# Patient Record
Sex: Female | Born: 1975
Health system: Southern US, Community
[De-identification: ages and names within clinical notes are randomized; demographics above are authoritative.]

## PROBLEM LIST (undated history)

## (undated) ENCOUNTER — Inpatient Hospital Stay (HOSPITAL_COMMUNITY): Payer: Self-pay

## (undated) DIAGNOSIS — D649 Anemia, unspecified: Secondary | ICD-10-CM

## (undated) DIAGNOSIS — O10919 Unspecified pre-existing hypertension complicating pregnancy, unspecified trimester: Secondary | ICD-10-CM

## (undated) DIAGNOSIS — Z332 Encounter for elective termination of pregnancy: Secondary | ICD-10-CM

## (undated) DIAGNOSIS — Z9889 Other specified postprocedural states: Secondary | ICD-10-CM

## (undated) DIAGNOSIS — R112 Nausea with vomiting, unspecified: Secondary | ICD-10-CM

## (undated) DIAGNOSIS — I10 Essential (primary) hypertension: Secondary | ICD-10-CM

## (undated) HISTORY — DX: Other specified postprocedural states: Z98.890

## (undated) HISTORY — PX: ABLATION: SHX5711

## (undated) HISTORY — PX: WISDOM TOOTH EXTRACTION: SHX21

## (undated) HISTORY — PX: BUNIONECTOMY: SHX129

## (undated) HISTORY — PX: DILATION AND CURETTAGE OF UTERUS: SHX78

## (undated) HISTORY — DX: Essential (primary) hypertension: I10

## (undated) HISTORY — DX: Anemia, unspecified: D64.9

## (undated) HISTORY — DX: Other specified postprocedural states: R11.2

---

## 2002-11-26 ENCOUNTER — Emergency Department (HOSPITAL_COMMUNITY): Admission: EM | Admit: 2002-11-26 | Discharge: 2002-11-27 | Payer: Self-pay | Admitting: Emergency Medicine

## 2002-11-26 ENCOUNTER — Encounter: Payer: Self-pay | Admitting: Emergency Medicine

## 2002-11-26 ENCOUNTER — Emergency Department (HOSPITAL_COMMUNITY): Admission: EM | Admit: 2002-11-26 | Discharge: 2002-11-26 | Payer: Self-pay | Admitting: *Deleted

## 2002-11-26 ENCOUNTER — Encounter: Payer: Self-pay | Admitting: *Deleted

## 2003-01-11 ENCOUNTER — Ambulatory Visit (HOSPITAL_COMMUNITY): Admission: RE | Admit: 2003-01-11 | Discharge: 2003-01-11 | Payer: Self-pay | Admitting: Internal Medicine

## 2003-01-11 ENCOUNTER — Encounter (INDEPENDENT_AMBULATORY_CARE_PROVIDER_SITE_OTHER): Payer: Self-pay

## 2003-07-21 ENCOUNTER — Emergency Department (HOSPITAL_COMMUNITY): Admission: EM | Admit: 2003-07-21 | Discharge: 2003-07-22 | Payer: Self-pay | Admitting: Emergency Medicine

## 2007-08-09 ENCOUNTER — Emergency Department (HOSPITAL_COMMUNITY): Admission: EM | Admit: 2007-08-09 | Discharge: 2007-08-09 | Payer: Self-pay | Admitting: Emergency Medicine

## 2007-10-30 ENCOUNTER — Other Ambulatory Visit: Admission: RE | Admit: 2007-10-30 | Discharge: 2007-10-30 | Payer: Self-pay | Admitting: Gynecology

## 2009-10-28 ENCOUNTER — Other Ambulatory Visit: Admission: RE | Admit: 2009-10-28 | Discharge: 2009-10-28 | Payer: Self-pay | Admitting: Family Medicine

## 2011-01-12 NOTE — Op Note (Signed)
   NAME:  SHANOAH, ASBILL                ACCOUNT NO.:  [id number]   MEDICAL RECORD NO.:  [id number]                   PATIENT TYPE:  AMB   LOCATION:  ENDO                                 FACILITY:  [facility]   PHYSICIAN:  Lina Sar, M.D. [facility]               DATE OF BIRTH:  07-18-1976   DATE OF PROCEDURE:  01/11/2003  DATE OF DISCHARGE:                                 OPERATIVE REPORT   PROCEDURE:  Colonoscopy.   INDICATIONS FOR PROCEDURE:  This 35 year old African-American female  presented to the emergency room on November 26, 2002 with severe right lower  quadrant abdominal pain. CT scan of the abdomen showed terminal ileitis and  changes around the cecum with enlargement in lymph nodes worrisome for colon  disease. She has had complete resolution of her abdominal pain and has done  well on Pentasa 250 mg, 9 tablets a day. She is now undergoing colonoscopy  to further evaluate her for possible Crohn disease.   ENDOSCOPE:  Olympus single channel video endoscope.   SEDATION:  Versed 8 mg IV, Demerol 80 mg IV.   FINDINGS:  The Olympus single channel video endoscope passed under direct  vision through the rectum to the sigmoid colon. The patient was monitored by  pulse oximeter, her oxygen saturations were normal. Her prep was excellent.  The anal canal and the rectal ampulla was unremarkable. There were no  fistulas or fissures. The colonoscope passed easily through the normal  appearing descending colon, splenic flexure, transverse colon, hepatic  flexure to the cecum. The cecal pouch was completely unremarkable with  normal appearing ileocecal valve. We were able to intubate the terminal  ileum over the length of at least 15 cm and it appeared normal. There was no  evidence of Crohn disease, there were no inflammatory changes, no change in  the caliber of the terminal ileum. Video photographs as well as biopsies of  the terminal ileum were obtained. The ileocecal valve again  appeared normal.  The colonoscope was then retracted, colon decompressed. The patient  tolerated the procedure well.   IMPRESSION:  Normal colonoscopy to the terminal ileum status post biopsy, no  evidence of Crohn disease.   PLAN:  The patient's right lower quadrant abdominal pain and ileitis were  most likely infectious. There was no evidence of any chronic changes at this  time. She will discontinue her Pentasa and will check with [place] in six months  to make sure she remains asymptomatic.                                               Lina Sar, M.D. [facility]    DB/MEDQ  D:  01/11/2003  T:  01/11/2003  Job:  [id number]

## 2011-03-28 ENCOUNTER — Encounter: Payer: Self-pay | Admitting: Podiatry

## 2011-03-28 DIAGNOSIS — M84376A Stress fracture, unspecified foot, initial encounter for fracture: Secondary | ICD-10-CM

## 2011-12-25 ENCOUNTER — Other Ambulatory Visit: Payer: Self-pay | Admitting: Family Medicine

## 2011-12-25 DIAGNOSIS — Z113 Encounter for screening for infections with a predominantly sexual mode of transmission: Secondary | ICD-10-CM | POA: Insufficient documentation

## 2011-12-25 DIAGNOSIS — Z0000 Encounter for general adult medical examination without abnormal findings: Secondary | ICD-10-CM | POA: Insufficient documentation

## 2011-12-25 DIAGNOSIS — Z202 Contact with and (suspected) exposure to infections with a predominantly sexual mode of transmission: Secondary | ICD-10-CM | POA: Insufficient documentation

## 2012-01-01 ENCOUNTER — Other Ambulatory Visit (HOSPITAL_COMMUNITY)
Admission: RE | Admit: 2012-01-01 | Discharge: 2012-01-01 | Disposition: A | Discharge status: Home / Self Care | Payer: PRIVATE HEALTH INSURANCE | Source: Ambulatory Visit | Attending: Family Medicine | Admitting: Family Medicine

## 2012-07-07 ENCOUNTER — Ambulatory Visit (INDEPENDENT_AMBULATORY_CARE_PROVIDER_SITE_OTHER): Payer: PRIVATE HEALTH INSURANCE | Admitting: Obstetrics and Gynecology

## 2012-07-07 VITALS — Ht 66.0 in

## 2012-07-07 DIAGNOSIS — Z331 Pregnant state, incidental: Secondary | ICD-10-CM

## 2012-07-07 NOTE — Progress Notes (Signed)
NOB interview today. Pt c/o some nausea that is relieved with small meals.  Chem 10   SG 1.025  PH 5  Protein trace Urine sent for culture and labs drawn. Pt to be called for NOB w/u appt.   Flu vaccine offered.  Pt to think about it until appt.  ld

## 2012-07-08 LAB — PRENATAL PANEL VII
Antibody Screen: NEGATIVE
Basophils Relative: 0 % (ref 0–1)
Eosinophils Absolute: 0.1 10*3/uL (ref 0.0–0.7)
Eosinophils Relative: 1 % (ref 0–5)
HCT: 33.1 % — ABNORMAL LOW (ref 36.0–46.0)
HIV: NONREACTIVE
Hemoglobin: 11.3 g/dL — ABNORMAL LOW (ref 12.0–15.0)
MCH: 30.1 pg (ref 26.0–34.0)
MCHC: 34.1 g/dL (ref 30.0–36.0)
MCV: 88 fL (ref 78.0–100.0)
Monocytes Absolute: 0.5 10*3/uL (ref 0.1–1.0)
Monocytes Relative: 6 % (ref 3–12)
Rh Type: POSITIVE

## 2012-07-09 LAB — HEMOGLOBINOPATHY EVALUATION
Hemoglobin Other: 0 %
Hgb A2 Quant: 2.8 % (ref 2.2–3.2)

## 2012-07-09 LAB — CULTURE, OB URINE

## 2012-07-16 ENCOUNTER — Telehealth: Payer: Self-pay | Admitting: Obstetrics and Gynecology

## 2012-08-07 ENCOUNTER — Telehealth: Payer: Self-pay | Admitting: Obstetrics and Gynecology

## 2012-08-07 NOTE — Telephone Encounter (Signed)
Pt returned call, informed pt may try Delsym or Robitussin for the cough, may try Plain Sudafed for any nasal issues, may take Tylenol (reg/ES) as directed, if no relief to call office, pt voices agreement.

## 2012-08-07 NOTE — Telephone Encounter (Signed)
Tc to pt regarding msg.  Lm on vm to call back. 

## 2012-08-11 ENCOUNTER — Encounter: Payer: Self-pay | Admitting: Obstetrics and Gynecology

## 2012-08-11 ENCOUNTER — Ambulatory Visit (INDEPENDENT_AMBULATORY_CARE_PROVIDER_SITE_OTHER): Payer: PRIVATE HEALTH INSURANCE | Admitting: Obstetrics and Gynecology

## 2012-08-11 VITALS — BP 102/72 | Wt 177.0 lb

## 2012-08-11 DIAGNOSIS — Z331 Pregnant state, incidental: Secondary | ICD-10-CM

## 2012-08-11 DIAGNOSIS — Z1389 Encounter for screening for other disorder: Secondary | ICD-10-CM

## 2012-08-11 DIAGNOSIS — Z3687 Encounter for antenatal screening for uncertain dates: Secondary | ICD-10-CM

## 2012-08-11 LAB — POCT WET PREP (WET MOUNT)
KOH Wet Prep POC: NEGATIVE
Trichomonas Wet Prep HPF POC: NEGATIVE

## 2012-08-11 LAB — OB RESULTS CONSOLE GC/CHLAMYDIA
Chlamydia: NEGATIVE
Gonorrhea: NEGATIVE

## 2012-08-11 NOTE — Patient Instructions (Signed)
ABCs of Pregnancy  A  Antepartum care is very important. Be sure you see your doctor and get prenatal care as soon as you think you are pregnant. At this time, you will be tested for infection, genetic abnormalities and potential problems with you and the pregnancy. This is the time to discuss diet, exercise, work, medications, labor, pain medication during labor and the possibility of a cesarean delivery. Ask any questions that may concern you. It is important to see your doctor regularly throughout your pregnancy. Avoid exposure to toxic substances and chemicals - such as cleaning solvents, lead and mercury, some insecticides, and paint. Pregnant women should avoid exposure to paint fumes, and fumes that cause you to feel ill, dizzy or faint. When possible, it is a good idea to have a pre-pregnancy consultation with your caregiver to begin some important recommendations your caregiver suggests such as, taking folic acid, exercising, quitting smoking, avoiding alcoholic beverages, etc.  B  Breastfeeding is the healthiest choice for both you and your baby. It has many nutritional benefits for the baby and health benefits for the mother. It also creates a very tight and loving bond between the baby and mother. Talk to your doctor, your family and friends, and your employer about how you choose to feed your baby and how they can support you in your decision. Not all birth defects can be prevented, but a woman can take actions that may increase her chance of having a healthy baby. Many birth defects happen very early in pregnancy, sometimes before a woman even knows she is pregnant. Birth defects or abnormalities of any child in your or the father's family should be discussed with your caregiver. Get a good support bra as your breast size changes. Wear it especially when you exercise and when nursing.   C  Celebrate the news of your pregnancy with the your spouse/father and family. Childbirth classes are helpful to  take for you and the spouse/father because it helps to understand what happens during the pregnancy, labor and delivery. Cesarean delivery should be discussed with your doctor so you are prepared for that possibility. The pros and cons of circumcision if it is a boy, should be discussed with your pediatrician. Cigarette smoking during pregnancy can result in low birth weight babies. It has been associated with infertility, miscarriages, tubal pregnancies, infant death (mortality) and poor health (morbidity) in childhood. Additionally, cigarette smoking may cause long-term learning disabilities. If you smoke, you should try to quit before getting pregnant and not smoke during the pregnancy. Secondary smoke may also harm a mother and her developing baby. It is a good idea to ask people to stop smoking around you during your pregnancy and after the baby is born. Extra calcium is necessary when you are pregnant and is found in your prenatal vitamin, in dairy products, green leafy vegetables and in calcium supplements.  D  A healthy diet according to your current weight and height, along with vitamins and mineral supplements should be discussed with your caregiver. Domestic abuse or violence should be made known to your doctor right away to get the situation corrected. Drink more water when you exercise to keep hydrated. Discomfort of your back and legs usually develops and progresses from the middle of the second trimester through to delivery of the baby. This is because of the enlarging baby and uterus, which may also affect your balance. Do not take illegal drugs. Illegal drugs can seriously harm the baby and you. Drink extra   fluids (water is best) throughout pregnancy to help your body keep up with the increases in your blood volume. Drink at least 6 to 8 glasses of water, fruit juice, or milk each day. A good way to know you are drinking enough fluid is when your urine looks almost like clear water or is very light  yellow.   E  Eat healthy to get the nutrients you and your unborn baby need. Your meals should include the five basic food groups. Exercise (30 minutes of light to moderate exercise a day) is important and encouraged during pregnancy, if there are no medical problems or problems with the pregnancy. Exercise that causes discomfort or dizziness should be stopped and reported to your caregiver. Emotions during pregnancy can change from being ecstatic to depression and should be understood by you, your partner and your family.  F  Fetal screening with ultrasound, amniocentesis and monitoring during pregnancy and labor is common and sometimes necessary. Take 400 micrograms of folic acid daily both before, when possible, and during the first few months of pregnancy to reduce the risk of birth defects of the brain and spine. All women who could possibly become pregnant should take a vitamin with folic acid, every day. It is also important to eat a healthy diet with fortified foods (enriched grain products, including cereals, rice, breads, and pastas) and foods with natural sources of folate (orange juice, green leafy vegetables, beans, peanuts, broccoli, asparagus, peas, and lentils). The father should be involved with all aspects of the pregnancy including, the prenatal care, childbirth classes, labor, delivery, and postpartum time. Fathers may also have emotional concerns about being a father, financial needs, and raising a family.  G  Genetic testing should be done appropriately. It is important to know your family and the father's history. If there have been problems with pregnancies or birth defects in your family, report these to your doctor. Also, genetic counselors can talk with you about the information you might need in making decisions about having a family. You can call a major medical center in your area for help in finding a board-certified genetic counselor. Genetic testing and counseling should be done  before pregnancy when possible, especially if there is a history of problems in the mother's or father's family. Certain ethnic backgrounds are more at risk for genetic defects.  H  Get familiar with the hospital where you will be having your baby. Get to know how long it takes to get there, the labor and delivery area, and the hospital procedures. Be sure your medical insurance is accepted there. Get your home ready for the baby including, clothes, the baby's room (when possible), furniture and car seat. Hand washing is important throughout the day, especially after handling raw meat and poultry, changing the baby's diaper or using the bathroom. This can help prevent the spread of many bacteria and viruses that cause infection. Your hair may become dry and thinner, but will return to normal a few weeks after the baby is born. Heartburn is a common problem that can be treated by taking antacids recommended by your caregiver, eating smaller meals 5 or 6 times a day, not drinking liquids when eating, drinking between meals and raising the head of your bed 2 to 3 inches.  I  Insurance to cover you, the baby, doctor and hospital should be reviewed so that you will be prepared to pay any costs not covered by your insurance plan. If you do not have medical insurance,   there are usually clinics and services available for you in your community. Take 30 milligrams of iron during your pregnancy as prescribed by your doctor to reduce the risk of low red blood cells (anemia) later in pregnancy. All women of childbearing age should eat a diet rich in iron.  J  There should be a joint effort for the mother, father and any other children to adapt to the pregnancy financially, emotionally, and psychologically during the pregnancy. Join a support group for moms-to-be. Or, join a class on parenting or childbirth. Have the family participate when possible.  K  Know your limits. Let your caregiver know if you experience any of the  following:   · Pain of any kind.  · Strong cramps.  · You develop a lot of weight in a short period of time (5 pounds in 3 to 5 days).  · Vaginal bleeding, leaking of amniotic fluid.  · Headache, vision problems.  · Dizziness, fainting, shortness of breath.  · Chest pain.  · Fever of 102° F (38.9° C) or higher.  · Gush of clear fluid from your vagina.  · Painful urination.  · Domestic violence.  · Irregular heartbeat (palpitations).  · Rapid beating of the heart (tachycardia).  · Constant feeling sick to your stomach (nauseous) and vomiting.  · Trouble walking, fluid retention (edema).  · Muscle weakness.  · If your baby has decreased activity.  · Persistent diarrhea.  · Abnormal vaginal discharge.  · Uterine contractions at 20-minute intervals.  · Back pain that travels down your leg.  L  Learn and practice that what you eat and drink should be in moderation and healthy for you and your baby. Legal drugs such as alcohol and caffeine are important issues for pregnant women. There is no safe amount of alcohol a woman can drink while pregnant. Fetal alcohol syndrome, a disorder characterized by growth retardation, facial abnormalities, and central nervous system dysfunction, is caused by a woman's use of alcohol during pregnancy. Caffeine, found in tea, coffee, soft drinks and chocolate, should also be limited. Be sure to read labels when trying to cut down on caffeine during pregnancy. More than 200 foods, beverages, and over-the-counter medications contain caffeine and have a high salt content! There are coffees and teas that do not contain caffeine.  M  Medical conditions such as diabetes, epilepsy, and high blood pressure should be treated and kept under control before pregnancy when possible, but especially during pregnancy. Ask your caregiver about any medications that may need to be changed or adjusted during pregnancy. If you are currently taking any medications, ask your caregiver if it is safe to take them  while you are pregnant or before getting pregnant when possible. Also, be sure to discuss any herbs or vitamins you are taking. They are medicines, too! Discuss with your doctor all medications, prescribed and over-the-counter, that you are taking. During your prenatal visit, discuss the medications your doctor may give you during labor and delivery.  N  Never be afraid to ask your doctor or caregiver questions about your health, the progress of the pregnancy, family problems, stressful situations, and recommendation for a pediatrician, if you do not have one. It is better to take all precautions and discuss any questions or concerns you may have during your office visits. It is a good idea to write down your questions before you visit the doctor.  O  Over-the-counter cough and cold remedies may contain alcohol or other ingredients that should   be avoided during pregnancy. Ask your caregiver about prescription, herbs or over-the-counter medications that you are taking or may consider taking while pregnant.   P  Physical activity during pregnancy can benefit both you and your baby by lessening discomfort and fatigue, providing a sense of well-being, and increasing the likelihood of early recovery after delivery. Light to moderate exercise during pregnancy strengthens the belly (abdominal) and back muscles. This helps improve posture. Practicing yoga, walking, swimming, and cycling on a stationary bicycle are usually safe exercises for pregnant women. Avoid scuba diving, exercise at high altitudes (over 3000 feet), skiing, horseback riding, contact sports, etc. Always check with your doctor before beginning any kind of exercise, especially during pregnancy and especially if you did not exercise before getting pregnant.  Q  Queasiness, stomach upset and morning sickness are common during pregnancy. Eating a couple of crackers or dry toast before getting out of bed. Foods that you normally love may make you feel sick to  your stomach. You may need to substitute other nutritious foods. Eating 5 or 6 small meals a day instead of 3 large ones may make you feel better. Do not drink with your meals, drink between meals. Questions that you have should be written down and asked during your prenatal visits.  R  Read about and make plans to baby-proof your home. There are important tips for making your home a safer environment for your baby. Review the tips and make your home safer for you and your baby. Read food labels regarding calories, salt and fat content in the food.  S  Saunas, hot tubs, and steam rooms should be avoided while you are pregnant. Excessive high heat may be harmful during your pregnancy. Your caregiver will screen and examine you for sexually transmitted diseases and genetic disorders during your prenatal visits. Learn the signs of labor. Sexual relations while pregnant is safe unless there is a medical or pregnancy problem and your caregiver advises against it.  T  Traveling long distances should be avoided especially in the third trimester of your pregnancy. If you do have to travel out of state, be sure to take a copy of your medical records and medical insurance plan with you. You should not travel long distances without seeing your doctor first. Most airlines will not allow you to travel after 36 weeks of pregnancy. Toxoplasmosis is an infection caused by a parasite that can seriously harm an unborn baby. Avoid eating undercooked meat and handling cat litter. Be sure to wear gloves when gardening. Tingling of the hands and fingers is not unusual and is due to fluid retention. This will go away after the baby is born.  U  Womb (uterus) size increases during the first trimester. Your kidneys will begin to function more efficiently. This may cause you to feel the need to urinate more often. You may also leak urine when sneezing, coughing or laughing. This is due to the growing uterus pressing against your bladder,  which lies directly in front of and slightly under the uterus during the first few months of pregnancy. If you experience burning along with frequency of urination or bloody urine, be sure to tell your doctor. The size of your uterus in the third trimester may cause a problem with your balance. It is advisable to maintain good posture and avoid wearing high heels during this time. An ultrasound of your baby may be necessary during your pregnancy and is safe for you and your baby.  V    Vaccinations are an important concern for pregnant women. Get needed vaccines before pregnancy. Center for Disease Control (www.cdc.gov) has clear guidelines for the use of vaccines during pregnancy. Review the list, be sure to discuss it with your doctor. Prenatal vitamins are helpful and healthy for you and the baby. Do not take extra vitamins except what is recommended. Taking too much of certain vitamins can cause overdose problems. Continuous vomiting should be reported to your caregiver. Varicose veins may appear especially if there is a family history of varicose veins. They should subside after the delivery of the baby. Support hose helps if there is leg discomfort.  W  Being overweight or underweight during pregnancy may cause problems. Try to get within 15 pounds of your ideal weight before pregnancy. Remember, pregnancy is not a time to be dieting! Do not stop eating or start skipping meals as your weight increases. Both you and your baby need the calories and nutrition you receive from a healthy diet. Be sure to consult with your doctor about your diet. There is a formula and diet plan available depending on whether you are overweight or underweight. Your caregiver or nutritionist can help and advise you if necessary.  X  Avoid X-rays. If you must have dental work or diagnostic tests, tell your dentist or physician that you are pregnant so that extra care can be taken. X-rays should only be taken when the risks of not taking  them outweigh the risk of taking them. If needed, only the minimum amount of radiation should be used. When X-rays are necessary, protective lead shields should be used to cover areas of the body that are not being X-rayed.  Y  Your baby loves you. Breastfeeding your baby creates a loving and very close bond between the two of you. Give your baby a healthy environment to live in while you are pregnant. Infants and children require constant care and guidance. Their health and safety should be carefully watched at all times. After the baby is born, rest or take a nap when the baby is sleeping.  Z  Get your ZZZs. Be sure to get plenty of rest. Resting on your side as often as possible, especially on your left side is advised. It provides the best circulation to your baby and helps reduce swelling. Try taking a nap for 30 to 45 minutes in the afternoon when possible. After the baby is born rest or take a nap when the baby is sleeping. Try elevating your feet for that amount of time when possible. It helps the circulation in your legs and helps reduce swelling.   Most information courtesy of the CDC.  Document Released: 08/13/2005 Document Revised: 11/05/2011 Document Reviewed: 04/27/2009  [employer]® Patient Information ©2013 [employer].

## 2012-08-11 NOTE — Progress Notes (Signed)
[redacted]w[redacted]d NOB workup. Pt states her last pap was in March of this year. Pt c/o ligament pain.

## 2012-08-12 ENCOUNTER — Encounter: Payer: Self-pay | Admitting: Obstetrics and Gynecology

## 2012-08-12 LAB — AFP, QUAD SCREEN
Curr Gest Age: 14.2 wks.days
MoM for AFP: 1.18
Tri 18 Scr Risk Est: NEGATIVE
Trisomy 18 (Edward) Syndrome Interp.: 1:11000 {titer}
uE3 Value: 0.3 ng/mL

## 2012-08-12 LAB — GC/CHLAMYDIA PROBE AMP
CT Probe RNA: NEGATIVE
GC Probe RNA: NEGATIVE

## 2012-08-12 NOTE — Progress Notes (Signed)
[facility] NEW OB EXAMINATION   Angela Graham is a 36 y.o. female, G3P0020, who presents at [redacted]w[redacted]d gestation for a new obstetrical examination. She is doing well.  The following portions of the patient's history were reviewed and updated as appropriate: allergies, current medications, past family history, past medical history, past social history, past surgical history and problem list.  OB History    Grav Para Term Preterm Abortions TAB SAB Ect Mult Living   3 0   2           Past Medical History  Diagnosis Date  . PONV (postoperative nausea and vomiting)   . Hypertension   . Anemia   . Headache     Past Surgical History  Procedure Date  . Dilation and curettage of uterus     x 2 EAB  . Wisdom tooth extraction     Family History  Problem Relation Age of Onset  . Depression Mother   . Hypertension Mother   . Cancer Father 17    prostate  . Hypertension Brother   . Cancer Paternal Uncle     prostate  . Depression Maternal Grandmother   . Heart disease Maternal Grandmother     CHF  . Hypertension Maternal Grandmother   . Stroke Maternal Grandmother   . Cancer Maternal Grandfather     brain  . Cancer Paternal Grandfather     colon  . Hypertension Brother     Social History:  reports that she has never smoked. She has never used smokeless tobacco. She reports that she does not drink alcohol or use illicit drugs.  Allergies: No Known Allergies  Medications: prenatal vitamins   Objective:    BP 102/72  Wt 177 lb (80.287 kg)  LMP 05/03/2012    Weight:  Wt Readings from Last 1 Encounters:  08/11/12 177 lb (80.287 kg)          BMI: There is no height on file to calculate BMI.  General Appearance: Alert, appropriate appearance for age. No acute distress HEENT: Grossly normal Neck / Thyroid: Supple, no masses, nodes or enlargement Lungs: clear to auscultation bilaterally Back: No CVA tenderness Breast Exam: No masses or nodes.No dimpling, nipple retraction or  discharge. Cardiovascular: Regular rate and rhythm. S1, S2, no murmur Gastrointestinal: Soft, non-tender, no masses or organomegaly.                               Fundal height: 14 weeks                               Fetal heart tones audible: yes  ++++++++++++++++++++++++++++++++++++++++++++++++++++++++  Pelvic Exam: External genitalia: normal general appearance Vaginal: normal without tenderness, induration or masses and relaxation: No Cervix: normal appearance Adnexa: normal bimanual exam Uterus: gravid, nontender, 14 weeks size  ++++++++++++++++++++++++++++++++++++++++++++++++++++++++  Lymphatic Exam: Non-palpable nodes in neck, clavicular, axillary, or inguinal regions Neurologic: Normal speech, no tremor  Psychiatric: Alert and oriented, appropriate affect.  Prenatal labs: ABO, Rh: A/POS/-- (11/11 1548) Antibody: NEG (11/11 1548) Rubella:  immune RPR: NON REAC (11/11 1548)  HBsAg: NEGATIVE (11/11 1548)  HIV: NON REACTIVE (11/11 1548)  GBS:   pending until the third trimester Urine culture: Negative Hemoglobin: 14.3 Platelet count: 263,000  Wet Prep:   Previously done:            no  If no: Whiff:                     Negative                              Clue cells:             no                              PH:                        4.5                              Yeast:                    no                              Trichomoniasis:    no    Assessment:   36 y.o. female G3P0020 at [redacted]w[redacted]d gestation ( EDC is February 07, 2013) by: Normal Last menstrual period: yes Ultrasound:                               no                                 Plan:    GC and Chlamydia sent.  We discussed routine pregnancy issues:  Toxoplasmosis was reviewed.  The patient was told to avoid cat liter boxes and feces.  The patient was told to avoid predator fish including tuna because of our concerns for mercury consumption.  The patient was told to avoid  soft cheeses.  The patient was told to be sure that all lunch meats are well cooked.  Genetic screening was discussed. Quad screen said today  Our model for pregnancy management was reviewed.  Proper diet and exercise reviewed.  Return to office in 4 weeks.  Medications include:  Prenatal vitamins   Anatomy ultrasound next visit.  ABC's of pregnancy given.  Mylinda Latina.D.

## 2012-09-08 ENCOUNTER — Ambulatory Visit (INDEPENDENT_AMBULATORY_CARE_PROVIDER_SITE_OTHER): Payer: PRIVATE HEALTH INSURANCE | Admitting: Obstetrics and Gynecology

## 2012-09-08 ENCOUNTER — Ambulatory Visit (INDEPENDENT_AMBULATORY_CARE_PROVIDER_SITE_OTHER): Payer: PRIVATE HEALTH INSURANCE

## 2012-09-08 VITALS — BP 120/78 | Wt 183.0 lb

## 2012-09-08 DIAGNOSIS — Z3687 Encounter for antenatal screening for uncertain dates: Secondary | ICD-10-CM

## 2012-09-08 DIAGNOSIS — Z1389 Encounter for screening for other disorder: Secondary | ICD-10-CM

## 2012-09-08 DIAGNOSIS — Z331 Pregnant state, incidental: Secondary | ICD-10-CM

## 2012-09-08 NOTE — Progress Notes (Signed)
[redacted]w[redacted]d Quad screen: Within normal limits. Ultrasound: Single gestation, normal fluid, normal anatomy, female, cervix 3.83 cm, 267 g (82nd percentile). Return to office in 4 weeks. Round ligament pain. Dr. Stefano Gaul

## 2012-09-08 NOTE — Progress Notes (Signed)
[redacted]w[redacted]d  Pt c/o : pain when she sits up in her lower abdomen area.   Pt states she is not as hungry and only eating 3 times a day.

## 2012-09-09 LAB — US OB COMP + 14 WK

## 2012-10-06 ENCOUNTER — Encounter: Payer: Self-pay | Admitting: Obstetrics and Gynecology

## 2012-10-06 ENCOUNTER — Ambulatory Visit: Payer: PRIVATE HEALTH INSURANCE | Admitting: Obstetrics and Gynecology

## 2012-10-06 VITALS — BP 110/58 | Temp 98.4°F | Wt 188.0 lb

## 2012-10-06 DIAGNOSIS — Z331 Pregnant state, incidental: Secondary | ICD-10-CM

## 2012-10-06 MED ORDER — CEPHALEXIN 500 MG PO CAPS: 500.0000 mg | ORAL_CAPSULE | Freq: Two times a day (BID) | ORAL | Status: AC

## 2012-10-06 NOTE — Progress Notes (Signed)
[redacted]w[redacted]d C/o stuffy nose & green drainage it's only at NOC x 1 week

## 2012-10-06 NOTE — Progress Notes (Signed)
[redacted]w[redacted]d Complains of green drainage from her sinuses in the mornings.  Throat is clear.  Congested nose. Keflex 500 mg twice a day for 7 days. Return to office in 4 weeks. Dr. Stefano Gaul

## 2012-11-04 ENCOUNTER — Encounter: Payer: Self-pay | Admitting: Obstetrics and Gynecology

## 2012-11-04 ENCOUNTER — Ambulatory Visit: Payer: PRIVATE HEALTH INSURANCE | Admitting: Obstetrics and Gynecology

## 2012-11-04 VITALS — BP 110/70 | Wt 193.0 lb

## 2012-11-04 DIAGNOSIS — Z331 Pregnant state, incidental: Secondary | ICD-10-CM

## 2012-11-04 NOTE — Progress Notes (Signed)
[redacted]w[redacted]d A/P Glucola, hemoglobin and RPR today Fetal kick counts reviewed All patients questions answered Return in two weeks Continue Prenatal vitamins Blood type A pos

## 2012-11-04 NOTE — Progress Notes (Signed)
Glucola given @ 10:00 Pt had pancakes with extra syrup for breakfast

## 2012-11-04 NOTE — Patient Instructions (Signed)
Fetal Movement Counts Patient Name: __________________________________________________ Patient Due Date: ____________________ Kick counts is highly recommended in high risk pregnancies, but it is a good idea for every pregnant woman to do. Start counting fetal movements at 28 weeks of the pregnancy. Fetal movements increase after eating a full meal or eating or drinking something sweet (the blood sugar is higher). It is also important to drink plenty of fluids (well hydrated) before doing the count. Lie on your left side because it helps with the circulation or you can sit in a comfortable chair with your arms over your belly (abdomen) with no distractions around you. DOING THE COUNT  Try to do the count the same time of day each time you do it.  Mark the day and time, then see how long it takes for you to feel 10 movements (kicks, flutters, swishes, rolls). You should have at least 10 movements within 2 hours. You will most likely feel 10 movements in much less than 2 hours. If you do not, wait an hour and count again. After a couple of days you will see a pattern.  What you are looking for is a change in the pattern or not enough counts in 2 hours. Is it taking longer in time to reach 10 movements? SEEK MEDICAL CARE IF:  You feel less than 10 counts in 2 hours. Tried twice.  No movement in one hour.  The pattern is changing or taking longer each day to reach 10 counts in 2 hours.  You feel the baby is not moving as it usually does. Date: ____________ Movements: ____________ Start time: ____________ Finish time: ____________  Date: ____________ Movements: ____________ Start time: ____________ Finish time: ____________ Date: ____________ Movements: ____________ Start time: ____________ Finish time: ____________ Date: ____________ Movements: ____________ Start time: ____________ Finish time: ____________ Date: ____________ Movements: ____________ Start time: ____________ Finish time:  ____________ Date: ____________ Movements: ____________ Start time: ____________ Finish time: ____________ Date: ____________ Movements: ____________ Start time: ____________ Finish time: ____________ Date: ____________ Movements: ____________ Start time: ____________ Finish time: ____________  Date: ____________ Movements: ____________ Start time: ____________ Finish time: ____________ Date: ____________ Movements: ____________ Start time: ____________ Finish time: ____________ Date: ____________ Movements: ____________ Start time: ____________ Finish time: ____________ Date: ____________ Movements: ____________ Start time: ____________ Finish time: ____________ Date: ____________ Movements: ____________ Start time: ____________ Finish time: ____________ Date: ____________ Movements: ____________ Start time: ____________ Finish time: ____________ Date: ____________ Movements: ____________ Start time: ____________ Finish time: ____________  Date: ____________ Movements: ____________ Start time: ____________ Finish time: ____________ Date: ____________ Movements: ____________ Start time: ____________ Finish time: ____________ Date: ____________ Movements: ____________ Start time: ____________ Finish time: ____________ Date: ____________ Movements: ____________ Start time: ____________ Finish time: ____________ Date: ____________ Movements: ____________ Start time: ____________ Finish time: ____________ Date: ____________ Movements: ____________ Start time: ____________ Finish time: ____________ Date: ____________ Movements: ____________ Start time: ____________ Finish time: ____________  Date: ____________ Movements: ____________ Start time: ____________ Finish time: ____________ Date: ____________ Movements: ____________ Start time: ____________ Finish time: ____________ Date: ____________ Movements: ____________ Start time: ____________ Finish time: ____________ Date: ____________ Movements:  ____________ Start time: ____________ Finish time: ____________ Date: ____________ Movements: ____________ Start time: ____________ Finish time: ____________ Date: ____________ Movements: ____________ Start time: ____________ Finish time: ____________ Date: ____________ Movements: ____________ Start time: ____________ Finish time: ____________  Date: ____________ Movements: ____________ Start time: ____________ Finish time: ____________ Date: ____________ Movements: ____________ Start time: ____________ Finish time: ____________ Date: ____________ Movements: ____________ Start time: ____________ Finish time: ____________ Date: ____________ Movements:   ____________ Start time: ____________ Finish time: ____________ Date: ____________ Movements: ____________ Start time: ____________ Finish time: ____________ Date: ____________ Movements: ____________ Start time: ____________ Finish time: ____________ Date: ____________ Movements: ____________ Start time: ____________ Finish time: ____________  Date: ____________ Movements: ____________ Start time: ____________ Finish time: ____________ Date: ____________ Movements: ____________ Start time: ____________ Finish time: ____________ Date: ____________ Movements: ____________ Start time: ____________ Finish time: ____________ Date: ____________ Movements: ____________ Start time: ____________ Finish time: ____________ Date: ____________ Movements: ____________ Start time: ____________ Finish time: ____________ Date: ____________ Movements: ____________ Start time: ____________ Finish time: ____________ Date: ____________ Movements: ____________ Start time: ____________ Finish time: ____________  Date: ____________ Movements: ____________ Start time: ____________ Finish time: ____________ Date: ____________ Movements: ____________ Start time: ____________ Finish time: ____________ Date: ____________ Movements: ____________ Start time: ____________ Finish  time: ____________ Date: ____________ Movements: ____________ Start time: ____________ Finish time: ____________ Date: ____________ Movements: ____________ Start time: ____________ Finish time: ____________ Date: ____________ Movements: ____________ Start time: ____________ Finish time: ____________ Date: ____________ Movements: ____________ Start time: ____________ Finish time: ____________  Date: ____________ Movements: ____________ Start time: ____________ Finish time: ____________ Date: ____________ Movements: ____________ Start time: ____________ Finish time: ____________ Date: ____________ Movements: ____________ Start time: ____________ Finish time: ____________ Date: ____________ Movements: ____________ Start time: ____________ Finish time: ____________ Date: ____________ Movements: ____________ Start time: ____________ Finish time: ____________ Date: ____________ Movements: ____________ Start time: ____________ Finish time: ____________ Document Released: 09/12/2006 Document Revised: 11/05/2011 Document Reviewed: 03/15/2009 [employer] Patient Information 2013 [employer].  

## 2013-01-28 ENCOUNTER — Inpatient Hospital Stay (HOSPITAL_COMMUNITY)
Admission: AD | Admit: 2013-01-28 | Discharge: 2013-01-28 | Disposition: A | Discharge status: Home / Self Care | Payer: PRIVATE HEALTH INSURANCE | Source: Ambulatory Visit | Attending: Obstetrics and Gynecology | Admitting: Obstetrics and Gynecology

## 2013-01-28 ENCOUNTER — Encounter (HOSPITAL_COMMUNITY): Payer: Self-pay | Admitting: *Deleted

## 2013-01-28 ENCOUNTER — Other Ambulatory Visit: Payer: Self-pay | Admitting: Obstetrics and Gynecology

## 2013-01-28 DIAGNOSIS — O10919 Unspecified pre-existing hypertension complicating pregnancy, unspecified trimester: Secondary | ICD-10-CM | POA: Diagnosis present

## 2013-01-28 DIAGNOSIS — O10019 Pre-existing essential hypertension complicating pregnancy, unspecified trimester: Secondary | ICD-10-CM | POA: Insufficient documentation

## 2013-01-28 DIAGNOSIS — O10913 Unspecified pre-existing hypertension complicating pregnancy, third trimester: Secondary | ICD-10-CM

## 2013-01-28 DIAGNOSIS — E871 Hypo-osmolality and hyponatremia: Secondary | ICD-10-CM | POA: Insufficient documentation

## 2013-01-28 HISTORY — DX: Unspecified pre-existing hypertension complicating pregnancy, unspecified trimester: O10.919

## 2013-01-28 LAB — CBC
HCT: 35.3 % — ABNORMAL LOW (ref 36.0–46.0)
Hemoglobin: 11.9 g/dL — ABNORMAL LOW (ref 12.0–15.0)
MCH: 30.1 pg (ref 26.0–34.0)
MCHC: 33.7 g/dL (ref 30.0–36.0)
MCV: 89.4 fL (ref 78.0–100.0)
Platelets: 199 10*3/uL (ref 150–400)
RBC: 3.95 MIL/uL (ref 3.87–5.11)
RDW: 13.5 % (ref 11.5–15.5)
WBC: 10.3 10*3/uL (ref 4.0–10.5)

## 2013-01-28 LAB — COMPREHENSIVE METABOLIC PANEL
ALT: 12 U/L (ref 0–35)
AST: 23 U/L (ref 0–37)
Albumin: 2.9 g/dL — ABNORMAL LOW (ref 3.5–5.2)
Alkaline Phosphatase: 168 U/L — ABNORMAL HIGH (ref 39–117)
BUN: 6 mg/dL (ref 6–23)
CO2: 21 mEq/L (ref 19–32)
Calcium: 9.3 mg/dL (ref 8.4–10.5)
Chloride: 103 mEq/L (ref 96–112)
Creatinine, Ser: 0.51 mg/dL (ref 0.50–1.10)
GFR calc Af Amer: >90 mL/min (ref >90)
GFR calc non Af Amer: >90 mL/min (ref >90)
Glucose, Bld: 73 mg/dL (ref 70–99)
Potassium: 3.6 mEq/L (ref 3.5–5.1)
Sodium: 133 mEq/L — ABNORMAL LOW (ref 135–145)
Total Bilirubin: 0.3 mg/dL (ref 0.3–1.2)
Total Protein: 6.6 g/dL (ref 6.0–8.3)

## 2013-01-28 LAB — URIC ACID: Uric Acid, Serum: 3.8 mg/dL (ref 2.4–7.0)

## 2013-01-28 LAB — LACTATE DEHYDROGENASE: LDH: 188 U/L (ref 94–250)

## 2013-01-28 LAB — PROTEIN / CREATININE RATIO, URINE
Creatinine, Urine: 76 mg/dL
Protein Creatinine Ratio: 0.13 (ref 0.00–0.15)
Total Protein, Urine: 10.1 mg/dL

## 2013-01-28 NOTE — MAU Note (Signed)
Hx of chonic hypertension prior to preg.  Was taken of meds during the preg, today BP up, sent in for [facility] eval. Slight headache. Increased swelling in hands and feet, saw flashes yesterday. Denies epigastric pain.

## 2013-01-28 NOTE — MAU Provider Note (Signed)
History   Angela Graham is a 37 y.o. MBF at [redacted]w[redacted]d who presents for [facility] w/u after calling to report BP at home today 140-160s/100s.  Reports swelling for about 3 weeks, worse in last 1-2 days.  Also reports mild Lt temporal headache off and on today.  Scotoma x1 yesterday; none since.  Denies epigastric pain.  Denies VB, LOF, resp c/os, or UTI s/s.  No GI c/o's.  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  Accompanied by her husband.  Doesn't discern any ctxs, although occ'l present on NST tonight.  GFM. Pt is getting serial u/s and antenatal testing at office already for h/o CHTN.  She doesn't remember the name of medication she was prior to pregnancy, but stopped w/ pos pregnancy test.  No medication during the pregnancy.  Prior to pregnancy followed by [facility] physicians, thinks a Dr. Cliffton Asters or Delford Field.  Pt has a digital cuff at home, and during pregnancy has checked almost daily, and usually at bedtime.    CSN: [id number]  Arrival date and time: 01/28/13 1831   None     Chief Complaint  Patient presents with  . Hypertension   HPI  OB History   Grav Para Term Preterm Abortions TAB SAB Ect Mult Living   3    2     0      Past Medical History  Diagnosis Date  . Anemia   . Hypertension     Past Surgical History  Procedure Laterality Date  . No past surgeries      History reviewed. No pertinent family history.  History  Substance Use Topics  . Smoking status: Never Smoker   . Smokeless tobacco: Never Used  . Alcohol Use: No    Allergies: No Known Allergies  Prescriptions prior to admission  Medication Sig Dispense Refill  . IRON PO Take 1 tablet by mouth at bedtime.      . Prenatal Vit-Fe Fumarate-FA (PRENATAL MULTIVITAMIN) TABS Take 1 tablet by mouth at bedtime.        ROS--see HPI Physical Exam   INITIAL BP IN TRIAGE=150/103;  1ST 3 BP'S IN ROOM 5: 131-134/91-96 NEXT 3 BP'S=127-135/85-90  Blood pressure 135/80, pulse 92, temperature 98.8 F (37.1 C),  temperature source Oral, resp. rate 18, height 5' 6.5" (1.689 m), weight 207 lb (93.895 kg). .. Filed Vitals:   01/28/13 2100 01/28/13 2115 01/28/13 2130 01/28/13 2145  BP: 127/90 135/85 141/87 135/80  Pulse: 97 100 102 92  Temp:      TempSrc:      Resp:      Height:      Weight:       LAST 3 BP's in MAU as follows: 126/81, 130/88, & last one before d/c=122/70  Physical Exam  Constitutional: She is oriented to person, place, and time. She appears well-developed and well-nourished. No distress.  HENT:  Head: Normocephalic and atraumatic.  Eyes: Pupils are equal, round, and reactive to light.  Cardiovascular: Normal rate.   Respiratory: Effort normal.  GI: Soft.  gravid  Genitourinary:  Pelvic deferred  Musculoskeletal: She exhibits edema.  Primarily ankle and pedal edema, w/ Rt pedal edema>Lt; Mild generalized edema in shins, but not pitting  Neurological: She is alert and oriented to person, place, and time. She has normal reflexes.  No clonus  Skin: Skin is warm and dry.  Psychiatric: She has a normal mood and affect. Her behavior is normal. Judgment and thought content normal.    .. Results  for orders placed during the hospital encounter of 01/28/13 (from the past 24 hour(s))  PROTEIN / CREATININE RATIO, URINE     Status: None   Collection Time    01/28/13  6:55 PM      Result Value Range   Creatinine, Urine 76.00     Total Protein, Urine 10.1     PROTEIN CREATININE RATIO 0.13  0.00 - 0.15  CBC     Status: Abnormal   Collection Time    01/28/13  8:40 PM      Result Value Range   WBC 10.3  4.0 - 10.5 K/uL   RBC 3.95  3.87 - 5.11 MIL/uL   Hemoglobin 11.9 (*) 12.0 - 15.0 g/dL   HCT 16.1 (*) 09.6 - 04.5 %   MCV 89.4  78.0 - 100.0 fL   MCH 30.1  26.0 - 34.0 pg   MCHC 33.7  30.0 - 36.0 g/dL   RDW 40.9  81.1 - 91.4 %   Platelets 199  150 - 400 K/uL  COMPREHENSIVE METABOLIC PANEL     Status: Abnormal   Collection Time    01/28/13  8:40 PM      Result Value Range    Sodium 133 (*) 135 - 145 mEq/L   Potassium 3.6  3.5 - 5.1 mEq/L   Chloride 103  96 - 112 mEq/L   CO2 21  19 - 32 mEq/L   Glucose, Bld 73  70 - 99 mg/dL   BUN 6  6 - 23 mg/dL   Creatinine, Ser 7.82  0.50 - 1.10 mg/dL   Calcium 9.3  8.4 - 95.6 mg/dL   Total Protein 6.6  6.0 - 8.3 g/dL   Albumin 2.9 (*) 3.5 - 5.2 g/dL   AST 23  0 - 37 U/L   ALT 12  0 - 35 U/L   Alkaline Phosphatase 168 (*) 39 - 117 U/L   Total Bilirubin 0.3  0.3 - 1.2 mg/dL   GFR calc non Af Amer >90  >90 mL/min   GFR calc Af Amer >90  >90 mL/min  LACTATE DEHYDROGENASE     Status: None   Collection Time    01/28/13  8:40 PM      Result Value Range   LDH 188  94 - 250 U/L  URIC ACID     Status: None   Collection Time    01/28/13  8:40 PM      Result Value Range   Uric Acid, Serum 3.8  2.4 - 7.0 mg/dL   MAU Course  Procedures 1. PIH labs 2. Protein/creatinine ratio 3. Serial BP's  4. NST  Assessment and Plan  1. [redacted]w[redacted]d 2.  CHTN  3. Slight hyponatremia, but otherwise normal PIH labs 4. Cat I FHT  1. In light of BP normalizing w/ rest in MAU will defer antihypertensive presently, and d/c'd home on Level 2 BR to stop work, effective tomorrow, and f/u w/ Dr. Su Hilt for a ROB w/ BPP in 2d, on 01/30/13. 2. PIH and labor precautions rev'd and FKC 3. Disc'd low Na diet and adequate/primarily water intake; comfort measures for edema & mild headache disc'd. 4. Rev'd likelihood of IOL by EDC 5. F/u as scheduled or prn  Dorell Gatlin H 01/28/2013, 10:40 PM

## 2013-01-29 ENCOUNTER — Encounter (HOSPITAL_COMMUNITY): Payer: Self-pay

## 2013-01-29 ENCOUNTER — Encounter (HOSPITAL_COMMUNITY): Payer: Self-pay | Admitting: *Deleted

## 2013-01-29 DIAGNOSIS — O10919 Unspecified pre-existing hypertension complicating pregnancy, unspecified trimester: Secondary | ICD-10-CM

## 2013-01-29 HISTORY — DX: Unspecified pre-existing hypertension complicating pregnancy, unspecified trimester: O10.919

## 2013-02-07 ENCOUNTER — Encounter (HOSPITAL_COMMUNITY): Payer: Self-pay

## 2013-02-07 ENCOUNTER — Inpatient Hospital Stay (HOSPITAL_COMMUNITY)
Admission: RE | Admit: 2013-02-07 | Discharge: 2013-02-11 | DRG: 765 | Disposition: A | Discharge status: Home / Self Care | Payer: PRIVATE HEALTH INSURANCE | Source: Ambulatory Visit | Attending: Obstetrics and Gynecology | Admitting: Obstetrics and Gynecology

## 2013-02-07 DIAGNOSIS — O1002 Pre-existing essential hypertension complicating childbirth: Secondary | ICD-10-CM | POA: Diagnosis present

## 2013-02-07 DIAGNOSIS — O411090 Infection of amniotic sac and membranes, unspecified, unspecified trimester, not applicable or unspecified: Secondary | ICD-10-CM | POA: Diagnosis present

## 2013-02-07 DIAGNOSIS — O34599 Maternal care for other abnormalities of gravid uterus, unspecified trimester: Secondary | ICD-10-CM | POA: Diagnosis present

## 2013-02-07 DIAGNOSIS — N838 Other noninflammatory disorders of ovary, fallopian tube and broad ligament: Secondary | ICD-10-CM | POA: Diagnosis present

## 2013-02-07 LAB — CBC
Platelets: 209 10*3/uL (ref 150–400)
RBC: 3.89 MIL/uL (ref 3.87–5.11)
RDW: 13.6 % (ref 11.5–15.5)
WBC: 9.4 10*3/uL (ref 4.0–10.5)

## 2013-02-07 LAB — COMPREHENSIVE METABOLIC PANEL
ALT: 13 U/L (ref 0–35)
Albumin: 2.8 g/dL — ABNORMAL LOW (ref 3.5–5.2)
Alkaline Phosphatase: 190 U/L — ABNORMAL HIGH (ref 39–117)
BUN: 8 mg/dL (ref 6–23)
Calcium: 9.2 mg/dL (ref 8.4–10.5)
GFR calc Af Amer: >90 mL/min (ref >90)
Potassium: 4.1 mEq/L (ref 3.5–5.1)
Sodium: 134 mEq/L — ABNORMAL LOW (ref 135–145)
Total Protein: 6 g/dL (ref 6.0–8.3)

## 2013-02-07 LAB — URIC ACID: Uric Acid, Serum: 4.8 mg/dL (ref 2.4–7.0)

## 2013-02-07 MED ORDER — CITRIC ACID-SODIUM CITRATE 334-500 MG/5ML PO SOLN
30.0000 mL | ORAL | Status: DC | PRN
  Administered 2013-02-09: 30 mL via ORAL
  Filled 2013-02-07: qty 15

## 2013-02-07 MED ORDER — LACTATED RINGERS IV SOLN
500.0000 mL | INTRAVENOUS | Status: DC | PRN
  Administered 2013-02-08 (×2): 500 mL via INTRAVENOUS

## 2013-02-07 MED ORDER — LIDOCAINE HCL (PF) 1 % IJ SOLN: 30.0000 mL | INTRAMUSCULAR | Status: DC | PRN

## 2013-02-07 MED ORDER — MISOPROSTOL 25 MCG QUARTER TABLET
25.0000 ug | ORAL_TABLET | ORAL | Status: DC | PRN
  Administered 2013-02-07 – 2013-02-08 (×2): 25 ug via VAGINAL
  Filled 2013-02-07 (×3): qty 0.25

## 2013-02-07 MED ORDER — ACETAMINOPHEN 325 MG PO TABS
650.0000 mg | ORAL_TABLET | ORAL | Status: DC | PRN
  Administered 2013-02-08: 650 mg via ORAL
  Filled 2013-02-07: qty 2

## 2013-02-07 MED ORDER — OXYCODONE-ACETAMINOPHEN 5-325 MG PO TABS: 1.0000 | ORAL_TABLET | ORAL | Status: DC | PRN

## 2013-02-07 MED ORDER — TERBUTALINE SULFATE 1 MG/ML IJ SOLN: 0.2500 mg | Freq: Once | INTRAMUSCULAR | Status: AC | PRN

## 2013-02-07 MED ORDER — OXYTOCIN BOLUS FROM INFUSION: 500.0000 mL | INTRAVENOUS | Status: DC

## 2013-02-07 MED ORDER — OXYTOCIN 40 UNITS IN LACTATED RINGERS INFUSION - SIMPLE MED: 62.5000 mL/h | INTRAVENOUS | Status: DC

## 2013-02-07 MED ORDER — IBUPROFEN 600 MG PO TABS: 600.0000 mg | ORAL_TABLET | Freq: Four times a day (QID) | ORAL | Status: DC | PRN

## 2013-02-07 MED ORDER — ZOLPIDEM TARTRATE 5 MG PO TABS
5.0000 mg | ORAL_TABLET | Freq: Every evening | ORAL | Status: DC | PRN
  Administered 2013-02-07: 5 mg via ORAL
  Filled 2013-02-07: qty 1

## 2013-02-07 MED ORDER — LACTATED RINGERS IV SOLN
INTRAVENOUS | Status: DC
  Administered 2013-02-07 – 2013-02-08 (×3): via INTRAVENOUS

## 2013-02-07 MED ORDER — ONDANSETRON HCL 4 MG/2ML IJ SOLN: 4.0000 mg | Freq: Four times a day (QID) | INTRAMUSCULAR | Status: DC | PRN

## 2013-02-07 NOTE — H&P (Signed)
Angela Graham is a 37 y.o. female, G3P0020 at [redacted]w[redacted]d, presenting for IOL for CHTN. C/o HA, she has had for a couple of days. Denies visual changes, right epigastric pain, VB, UCs, recent fever, resp or GI c/o's, UTI or PIH s/s. GFM.   Patient Active Problem List   Diagnosis Date Noted  . HTN in pregnancy, chronic 01/29/2013  . Stress fracture of foot 03/28/2011    History of present pregnancy: Patient entered care at [redacted]w[redacted]d.   EDC of 02/07/13 was established by LMP.   Anatomy scan:  [redacted]w[redacted]d, with normal findings and an anterior placenta.   Additional [place] evaluations:  BPP and EFW at [redacted]w[redacted]d - 83rd%ile 8lb3oz;  8/8.   Significant prenatal events:  02/06/13 Protein-Creatinine ratio 0.07,    Last evaluation:  02/06/13 at [redacted]w[redacted]d  OB History   Grav Para Term Preterm Abortions TAB SAB Ect Mult Living   3    2     0     Past Medical History  Diagnosis Date  . PONV (postoperative nausea and vomiting)   . Headache(784.0)   . Anemia   . Hypertension   . HTN in pregnancy, chronic 01/29/2013   Past Surgical History  Procedure Laterality Date  . Dilation and curettage of uterus      x 2 EAB  . Wisdom tooth extraction    . No past surgeries     Family History: family history includes Cancer in her maternal grandfather, paternal grandfather, and paternal uncle; Cancer (age of onset: 85) in her father; Depression in her maternal grandmother and mother; Heart disease in her maternal grandmother; Hypertension in her brothers, maternal grandmother, and mother; and Stroke in her maternal grandmother. Social History:  reports that she has never smoked. She has never used smokeless tobacco. She reports that she does not drink alcohol or use illicit drugs.   Prenatal Transfer Tool  Maternal Diabetes: No Genetic Screening: Normal Maternal Ultrasounds/Referrals: Normal Fetal Ultrasounds or other Referrals:  None Maternal Substance Abuse:  No Significant Maternal Medications:  None Significant  Maternal Lab Results: Lab values include: Group B Strep negative    ROS: see HPI above, all other systems are negative  No Known Allergies     Blood pressure 126/79, pulse 98, temperature 98.1 F (36.7 C), temperature source Oral, resp. rate 20, height 5\' 6"  (1.676 m), weight 207 lb (93.895 kg), last menstrual period 05/03/2012.  Chest clear Heart RRR without murmur Abd gravid, NT Ext: WNL DTR: WNL  Pelvic:  Dilation: Closed Effacement (%): 30 Cervical Position: Posterior Station: -2 Presentation: Vertex Exam by:: J. Rondell Pardon, CNM   FHR: BL= 150; Cat I UCs:  none  Prenatal labs: ABO, Rh: A/POS/-- (11/11 1548) Antibody: NEG (11/11 1548) Rubella:   Immune RPR: NON REAC (03/11 1011)  HBsAg: NEGATIVE (11/11 1548)  HIV: NON REACTIVE (11/11 1548)  GBS:  Negative Sickle cell/Hgb electrophoresis:  Normal Study Pap:  unknown GC:  negative Chlamydia:  negative Genetic screenings:  negative Glucola:  104 Other:      Assessment/Plan: IUP at [redacted]w[redacted]d Unfavorable cx GBS neg CHTN - not on meds during pregnancy Protein - Creatinine ratio yesterday = 0.07  Admit to BS per consult with Dr. Su Hilt as attending MD Routine [facility] orders Initiate Cytotec protocol PIH labs C/w Dr. Molly Maduro PRN    Rowan Blase, MSN 02/07/2013, 10:08 PM

## 2013-02-08 ENCOUNTER — Encounter (HOSPITAL_COMMUNITY): Payer: Self-pay

## 2013-02-08 LAB — CBC
HCT: 36.1 % (ref 36.0–46.0)
MCH: 31 pg (ref 26.0–34.0)
MCV: 88.9 fL (ref 78.0–100.0)
RBC: 4.06 MIL/uL (ref 3.87–5.11)
WBC: 13.1 10*3/uL — ABNORMAL HIGH (ref 4.0–10.5)

## 2013-02-08 LAB — RPR: RPR Ser Ql: NONREACTIVE

## 2013-02-08 MED ORDER — FENTANYL 2.5 MCG/ML BUPIVACAINE 1/10 % EPIDURAL INFUSION (WH - ANES)
14.0000 mL/h | INTRAMUSCULAR | Status: DC | PRN
  Administered 2013-02-08: 14 mL/h via EPIDURAL
  Filled 2013-02-08 (×2): qty 125

## 2013-02-08 MED ORDER — LACTATED RINGERS IV SOLN: 500.0000 mL | Freq: Once | INTRAVENOUS | Status: DC

## 2013-02-08 MED ORDER — LACTATED RINGERS IV SOLN
INTRAVENOUS | Status: DC
  Administered 2013-02-08: 17:00:00 via INTRAUTERINE

## 2013-02-08 MED ORDER — PHENYLEPHRINE 40 MCG/ML (10ML) SYRINGE FOR IV PUSH (FOR BLOOD PRESSURE SUPPORT): 80.0000 ug | PREFILLED_SYRINGE | INTRAVENOUS | Status: DC | PRN

## 2013-02-08 MED ORDER — AMPICILLIN-SULBACTAM SODIUM 3 (2-1) G IJ SOLR
3.0000 g | Freq: Four times a day (QID) | INTRAMUSCULAR | Status: DC
  Administered 2013-02-08: 3 g via INTRAVENOUS
  Filled 2013-02-08 (×4): qty 3

## 2013-02-08 MED ORDER — EPHEDRINE 5 MG/ML INJ: 10.0000 mg | INTRAVENOUS | Status: DC | PRN

## 2013-02-08 MED ORDER — BUPIVACAINE HCL (PF) 0.25 % IJ SOLN
INTRAMUSCULAR | Status: DC | PRN
  Administered 2013-02-08: 5 mL via EPIDURAL

## 2013-02-08 MED ORDER — DIPHENHYDRAMINE HCL 50 MG/ML IJ SOLN
12.5000 mg | INTRAMUSCULAR | Status: DC | PRN
Start: 2013-02-08 — End: 2013-02-09

## 2013-02-08 MED ORDER — FENTANYL 2.5 MCG/ML BUPIVACAINE 1/10 % EPIDURAL INFUSION (WH - ANES)
INTRAMUSCULAR | Status: DC | PRN
  Administered 2013-02-08: 14 mL/h via EPIDURAL

## 2013-02-08 MED ORDER — FENTANYL CITRATE 0.05 MG/ML IJ SOLN
100.0000 ug | Freq: Once | INTRAMUSCULAR | Status: AC
  Administered 2013-02-08: 100 ug via EPIDURAL

## 2013-02-08 MED ORDER — OXYTOCIN 40 UNITS IN LACTATED RINGERS INFUSION - SIMPLE MED
1.0000 m[IU]/min | INTRAVENOUS | Status: DC
  Administered 2013-02-08: 1 m[IU]/min via INTRAVENOUS
  Filled 2013-02-08: qty 1000

## 2013-02-08 MED ORDER — PHENYLEPHRINE 40 MCG/ML (10ML) SYRINGE FOR IV PUSH (FOR BLOOD PRESSURE SUPPORT)
80.0000 ug | PREFILLED_SYRINGE | INTRAVENOUS | Status: DC | PRN
  Filled 2013-02-08: qty 5

## 2013-02-08 MED ORDER — EPHEDRINE 5 MG/ML INJ
10.0000 mg | INTRAVENOUS | Status: DC | PRN
  Filled 2013-02-08: qty 4

## 2013-02-08 MED ORDER — FENTANYL CITRATE 0.05 MG/ML IJ SOLN
100.0000 ug | INTRAMUSCULAR | Status: DC | PRN
  Administered 2013-02-08 (×3): 100 ug via INTRAVENOUS
  Filled 2013-02-08 (×4): qty 2

## 2013-02-08 MED ORDER — LIDOCAINE HCL (PF) 1 % IJ SOLN
INTRAMUSCULAR | Status: DC | PRN
  Administered 2013-02-08 (×2): 4 mL

## 2013-02-08 NOTE — Progress Notes (Addendum)
  Subjective: Pt comfortable, husband at bedside.  Discussed initiating Cytotec.  R&B of induction with Cytotec were reviewed with the patient -- patient verbalizes understanding of these risks and wishes to proceed.   Objective: BP 148/92  Pulse 78  Temp(Src) 98.2 F (36.8 C) (Oral)  Resp 18  Ht 5\' 6"  (1.676 m)  Wt 207 lb (93.895 kg)  BMI 33.43 kg/m2  LMP 05/03/2012      FHT:  FHR: 150 bpm, variability: moderate,  accelerations:  Present,  decelerations:  Absent UC:   none  SVE:   Dilation: Closed Effacement (%): 30 Station: -2 Exam by:: Annamary Rummage, CNM  Assessment / Plan:  Cytotec inserted PIH labs ordered  Labor: IOL for CHTN Preeclampsia: HA for the last couple of days.   DTRs - WNL.  Denies visual changes or right epigastric pain Fetal Wellbeing: Cat I Pain Control: none at this time I/D: GBS neg, afibrile Anticipated MOD: SVD   Angela Graham 02/08/2013, 12:09 AM

## 2013-02-08 NOTE — Progress Notes (Signed)
Patient ID: Angela Graham, female   DOB: 23-Oct-1975, 37 y.o.   MRN: [id number] Angela Graham is a 37 y.o. G3P0020 at [redacted]w[redacted]d admitted for IOL for CHTN  Subjective: Rested some overnight, feels some occ cramping now, denies HA/N/V/RUQ pain, just ordered breakfast, requesting to shower this AM  Objective: BP 139/74  Pulse 85  Temp(Src) 99 F (37.2 C) (Oral)  Resp 20  Ht 5\' 6"  (1.676 m)  Wt 207 lb (93.895 kg)  BMI 33.43 kg/m2  LMP 05/03/2012     FHT:  FHR: 140 bpm, variability: moderate,  accelerations:  Present,  decelerations:  Present just now 2 mild variables UC:   irregular, every 2-6 minutes, not tracing well at times SVE:   1/60/-2  Foley balloon inserted without difficulty, inflated 60cc  Assessment / Plan: IOL rcv'd 2 doses of cytotec overnight,   Labor: IOL Preeclampsia:  labs stable and BP stable, no s/s preeclampsia Fetal Wellbeing:  Category I Pain Control:  n/a at present Anticipated MOD:  NSVD  Will begin low dose pitocin and run at steady rate for 2-3 hours, then begin titration  Pt and sig other verbalized understanding of POC and agree    Recheck after foley comes out or PRN  D/w Dr Normand Sloop    [name] 02/08/2013, 8:36 AM

## 2013-02-08 NOTE — Progress Notes (Signed)
Patient ID: Angela Graham, female   DOB: 04/09/1976, 37 y.o.   MRN: [id number] Angela Graham is a 37 y.o. G3P0020 at [redacted]w[redacted]d admitted for IOL  Subjective: Feeling significant pressure, rcv'd epidural bolus about an hour ago with good results, overall remains comfortable   Objective: BP 146/87  Pulse 131  Temp(Src) 99.7 F (37.6 C) (Oral)  Resp 20  Ht 5\' 6"  (1.676 m)  Wt 207 lb (93.895 kg)  BMI 33.43 kg/m2  LMP 05/03/2012     FHT:  FHR: 150 bpm, variability: moderate,  accelerations:  Present,  decelerations:  Present variable/late appears to be resolving UC:   regular, every 2 minutes SVE:   10/+2  Caput    Assessment / Plan: IOL, CHTN Suspected chorioamnionitis, just rcv'd PO tylenol, IVF bolus  Maternal tachycardia Low grade temp  Labor: 2nd stage Preeclampsia:  no s/s BP stable, good output  Fetal Wellbeing:  Category II Pain Control:  Epidural Anticipated MOD:  undetermined  Will begin pushing  unasyn 3g IVPB Dr Su Hilt updated     [name] 02/08/2013, 10:18 PM

## 2013-02-08 NOTE — Progress Notes (Signed)
Patient ID: Angela Graham, female   DOB: 1976/08/01, 37 y.o.   MRN: [id number] Angela Graham is a 37 y.o. G3P0020 at [redacted]w[redacted]d admitted for IOL  Subjective: Now comfortable w epidural   Objective: BP 122/73  Pulse 95  Temp(Src) 98.4 F (36.9 C) (Oral)  Resp 18  Ht 5\' 6"  (1.676 m)  Wt 207 lb (93.895 kg)  BMI 33.43 kg/m2  LMP 05/03/2012     FHT:  FHR: 140 bpm, variability: moderate,  accelerations:  Present,  decelerations:  Present variables, now some appearing late UC:   regular, every 1 minutes SVE:   Dilation: 7 Effacement (%): 70 Station: -2 Exam by:: Tasnim Balentine, cnm  IUPC placed without difficulty, period of tachysystole noted FSE placed  Assessment / Plan: IOL  Labor: amnioninfusion, pitocin off, IVF's 02 Preeclampsia:  no s/s BP stable  Fetal Wellbeing:  Category II Pain Control:  Epidural Anticipated MOD:  undetermined at present  Recheck 1-2hr or PRN  Dr Normand Sloop updated    [name] 02/08/2013, 4:58 PM

## 2013-02-08 NOTE — Progress Notes (Signed)
Patient ID: [name], female   DOB: 1976/01/10, 37 y.o.   MRN: [id number] Brayden Betters is a 37 y.o. G3P0020 at [redacted]w[redacted]d admitted for IOL  Subjective: Feeling more pressure recently, just now c/o increased pain w ctx and uncomfortable w VE  Objective: BP 140/90  Pulse 111  Temp(Src) 99.1 F (37.3 C) (Oral)  Resp 18  Ht 5\' 6"  (1.676 m)  Wt 207 lb (93.895 kg)  BMI 33.43 kg/m2  LMP 05/03/2012     FHT:  FHR: 140 bpm, variability: moderate,  accelerations:  Present,  decelerations:  Absent, +scalp stim, decels have resolved  UC:   regular, every 2-4 minutes SVE:   8/100/-1  vtx more synclitic now,   MVU's inadequate    Assessment / Plan: IOL  Labor: pitocin has been off, will restart now to achieve adequate MVU Preeclampsia:  no s/s, BP stable Fetal Wellbeing:  Category I Pain Control:  Epidural Anticipated MOD:  undetermined   Recheck after 2 hrs adequate MVU's  Updated Dr Marcy Siren M 02/08/2013, 7:43 PM

## 2013-02-08 NOTE — Anesthesia Preprocedure Evaluation (Signed)
Anesthesia Evaluation  Patient identified by MRN, date of birth, ID band Patient awake    Reviewed: Allergy & Precautions  History of Anesthesia Complications (+) PONV  Airway Mallampati: III TM Distance: >3 FB Neck ROM: Full    Dental no notable dental hx. (+) Teeth Intact   Pulmonary neg pulmonary ROS,  breath sounds clear to auscultation  Pulmonary exam normal       Cardiovascular hypertension, Rhythm:Regular Rate:Normal     Neuro/Psych  Headaches, negative psych ROS   GI/Hepatic negative GI ROS, Neg liver ROS,   Endo/Other  negative endocrine ROS  Renal/GU negative Renal ROS  negative genitourinary   Musculoskeletal negative musculoskeletal ROS (+)   Abdominal   Peds  Hematology negative hematology ROS (+)   Anesthesia Other Findings   Reproductive/Obstetrics (+) Pregnancy                           Anesthesia Physical Anesthesia Plan  ASA: II  Anesthesia Plan: Epidural   Post-op Pain Management:    Induction:   Airway Management Planned: Natural Airway  Additional Equipment:   Intra-op Plan:   Post-operative Plan:   Informed Consent: I have reviewed the patients History and Physical, chart, labs and discussed the procedure including the risks, benefits and alternatives for the proposed anesthesia with the patient or authorized representative who has indicated his/her understanding and acceptance.     Plan Discussed with: Anesthesiologist  Anesthesia Plan Comments:         Anesthesia Quick Evaluation

## 2013-02-08 NOTE — Progress Notes (Signed)
Patient ID: [name], female   DOB: 11-09-1975, 37 y.o.   MRN: [id number] Debbra Digiulio is a 37 y.o. G3P0020 at [redacted]w[redacted]d admitted for IOL   Subjective: Just rcv'd another dose of IV fentanyl, now requests epidural,   Objective: BP 122/73  Pulse 95  Temp(Src) 98.4 F (36.9 C) (Oral)  Resp 18  Ht 5\' 6"  (1.676 m)  Wt 207 lb (93.895 kg)  BMI 33.43 kg/m2  LMP 05/03/2012     FHT:  FHR: 140 bpm, variability: moderate,  accelerations:  Present,  decelerations:  Absent UC:   regular, every 1-2 minutes SVE:   Exam deferred     Assessment / Plan: IOL  Labor: early labor, now on pitocin Preeclampsia:  no s/s, repeat CBC just drawn Fetal Wellbeing:  Category I Pain Control:  Fentanyl and will get epidural now Anticipated MOD:  NSVD  Recheck after epidural   Update physician PRN   [name] 02/08/2013, 4:54 PM

## 2013-02-08 NOTE — Anesthesia Procedure Notes (Signed)
Epidural Patient location during procedure: OB Start time: 02/08/2013 3:45 PM  Staffing Anesthesiologist: Cynthea Zachman A. Performed by: anesthesiologist   Preanesthetic Checklist Completed: patient identified, site marked, surgical consent, pre-op evaluation, timeout performed, IV checked, risks and benefits discussed and monitors and equipment checked  Epidural Patient position: sitting Prep: site prepped and draped and DuraPrep Patient monitoring: continuous pulse ox and blood pressure Approach: midline Injection technique: LOR air  Needle:  Needle type: Tuohy  Needle gauge: 17 G Needle length: 9 cm and 9 Needle insertion depth: 7 cm Catheter type: closed end flexible Catheter size: 19 Gauge Catheter at skin depth: 12 cm Test dose: negative and Other  Assessment Events: blood not aspirated, injection not painful, no injection resistance, negative IV test and no paresthesia  Additional Notes Patient identified. Risks and benefits discussed including failed block, incomplete  Pain control, post dural puncture headache, nerve damage, paralysis, blood pressure Changes, nausea, vomiting, reactions to medications-both toxic and allergic and post Partum back pain. All questions were answered. Patient expressed understanding and wished to proceed. Sterile technique was used throughout procedure. Epidural site was Dressed with sterile barrier dressing. No paresthesias, signs of intravascular injection Or signs of intrathecal spread were encountered.  Patient was more comfortable after the epidural was dosed. Please see RN's note for documentation of vital signs and FHR which are stable.

## 2013-02-08 NOTE — Progress Notes (Signed)
Patient ID: Angela Graham, female   DOB: Jan 21, 1976, 37 y.o.   MRN: [id number] Angela Graham is a 37 y.o. G3P0020 at [redacted]w[redacted]d admitted for IOL  Subjective: rcv'd good benefit w fentanyl, declines epidural   Objective: BP 139/74  Pulse 85  Temp(Src) 98 F (36.7 C) (Oral)  Resp 20  Ht 5\' 6"  (1.676 m)  Wt 207 lb (93.895 kg)  BMI 33.43 kg/m2  LMP 05/03/2012     FHT:  FHR: 140 bpm, variability: moderate,  accelerations:  Present,  decelerations:  Absent UC:   regular, every 1-2 minutes SVE:   Dilation: 3.5 Effacement (%): 80 Station: -2 Exam by:: Omri Bertran, cnm  SROM, sm clear fluid, foley came out   Assessment / Plan: IOL   Labor: early labor, pitocin deferred at present, will observe for continued progress Preeclampsia:  no s/s, BP stable Fetal Wellbeing:  Category I Pain Control:  Fentanyl Anticipated MOD:  NSVD  Recheck 2-3hrs or PRN  Update physician PRN   [name] 02/08/2013, 11:05 AM

## 2013-02-09 ENCOUNTER — Encounter (HOSPITAL_COMMUNITY): Payer: Self-pay | Admitting: Anesthesiology

## 2013-02-09 ENCOUNTER — Encounter (HOSPITAL_COMMUNITY): Payer: Self-pay

## 2013-02-09 ENCOUNTER — Encounter (HOSPITAL_COMMUNITY)
Admission: RE | Disposition: A | Discharge status: Home / Self Care | Payer: Self-pay | Source: Ambulatory Visit | Attending: Obstetrics and Gynecology

## 2013-02-09 ENCOUNTER — Inpatient Hospital Stay (HOSPITAL_COMMUNITY): Payer: PRIVATE HEALTH INSURANCE | Admitting: Anesthesiology

## 2013-02-09 LAB — CBC
MCV: 90.1 fL (ref 78.0–100.0)
Platelets: 168 10*3/uL (ref 150–400)
RDW: 13.3 % (ref 11.5–15.5)
WBC: 20.6 10*3/uL — ABNORMAL HIGH (ref 4.0–10.5)

## 2013-02-09 SURGERY — Surgical Case
Anesthesia: Epidural | Site: Abdomen | Wound class: Clean Contaminated

## 2013-02-09 MED ORDER — ONDANSETRON HCL 4 MG/2ML IJ SOLN: 4.0000 mg | Freq: Three times a day (TID) | INTRAMUSCULAR | Status: DC | PRN

## 2013-02-09 MED ORDER — NALBUPHINE SYRINGE 5 MG/0.5 ML
5.0000 mg | INJECTION | INTRAMUSCULAR | Status: DC | PRN
  Filled 2013-02-09: qty 1

## 2013-02-09 MED ORDER — LACTATED RINGERS IV SOLN
INTRAVENOUS | Status: DC
  Administered 2013-02-09 – 2013-02-10 (×2): via INTRAVENOUS

## 2013-02-09 MED ORDER — OXYTOCIN 10 UNIT/ML IJ SOLN
40.0000 [IU] | INTRAVENOUS | Status: DC | PRN
  Administered 2013-02-09: 40 [IU] via INTRAVENOUS

## 2013-02-09 MED ORDER — 0.9 % SODIUM CHLORIDE (POUR BTL) OPTIME
TOPICAL | Status: DC | PRN
  Administered 2013-02-09: 200 mL
  Administered 2013-02-09: 300 mL

## 2013-02-09 MED ORDER — FENTANYL CITRATE 0.05 MG/ML IJ SOLN
INTRAMUSCULAR | Status: DC | PRN
  Administered 2013-02-09 (×2): 50 ug via INTRAVENOUS

## 2013-02-09 MED ORDER — OXYTOCIN 10 UNIT/ML IJ SOLN
INTRAMUSCULAR | Status: AC
  Filled 2013-02-09: qty 4

## 2013-02-09 MED ORDER — MORPHINE SULFATE 0.5 MG/ML IJ SOLN
INTRAMUSCULAR | Status: AC
  Filled 2013-02-09: qty 10

## 2013-02-09 MED ORDER — SODIUM CHLORIDE 0.9 % IJ SOLN: 3.0000 mL | INTRAMUSCULAR | Status: DC | PRN

## 2013-02-09 MED ORDER — MORPHINE SULFATE (PF) 0.5 MG/ML IJ SOLN
INTRAMUSCULAR | Status: DC | PRN
  Administered 2013-02-09 (×2): .5 mg via INTRAVENOUS

## 2013-02-09 MED ORDER — FENTANYL CITRATE 0.05 MG/ML IJ SOLN
INTRAMUSCULAR | Status: AC
  Filled 2013-02-09: qty 2

## 2013-02-09 MED ORDER — PHENYLEPHRINE HCL 10 MG/ML IJ SOLN
INTRAMUSCULAR | Status: DC | PRN
  Administered 2013-02-09 (×2): 80 ug via INTRAVENOUS

## 2013-02-09 MED ORDER — IBUPROFEN 600 MG PO TABS
600.0000 mg | ORAL_TABLET | Freq: Four times a day (QID) | ORAL | Status: DC
Start: 2013-02-09 — End: 2013-02-11
  Administered 2013-02-09 – 2013-02-11 (×9): 600 mg via ORAL
  Filled 2013-02-09 (×9): qty 1

## 2013-02-09 MED ORDER — SENNOSIDES-DOCUSATE SODIUM 8.6-50 MG PO TABS
2.0000 | ORAL_TABLET | Freq: Every day | ORAL | Status: DC
  Administered 2013-02-09 – 2013-02-10 (×2): 2 via ORAL

## 2013-02-09 MED ORDER — SIMETHICONE 80 MG PO CHEW: 80.0000 mg | CHEWABLE_TABLET | ORAL | Status: DC | PRN

## 2013-02-09 MED ORDER — DIPHENHYDRAMINE HCL 50 MG/ML IJ SOLN: 25.0000 mg | INTRAMUSCULAR | Status: DC | PRN

## 2013-02-09 MED ORDER — OXYTOCIN 40 UNITS IN LACTATED RINGERS INFUSION - SIMPLE MED: 62.5000 mL/h | INTRAVENOUS | Status: AC

## 2013-02-09 MED ORDER — DIPHENHYDRAMINE HCL 25 MG PO CAPS: 25.0000 mg | ORAL_CAPSULE | ORAL | Status: DC | PRN

## 2013-02-09 MED ORDER — SIMETHICONE 80 MG PO CHEW
80.0000 mg | CHEWABLE_TABLET | Freq: Three times a day (TID) | ORAL | Status: DC
  Administered 2013-02-09 – 2013-02-10 (×7): 80 mg via ORAL

## 2013-02-09 MED ORDER — OXYCODONE-ACETAMINOPHEN 5-325 MG PO TABS
1.0000 | ORAL_TABLET | ORAL | Status: DC | PRN
  Administered 2013-02-09 – 2013-02-11 (×6): 1 via ORAL
  Filled 2013-02-09 (×2): qty 1
  Filled 2013-02-09: qty 2
  Filled 2013-02-09 (×3): qty 1

## 2013-02-09 MED ORDER — FENTANYL CITRATE 0.05 MG/ML IJ SOLN: 25.0000 ug | INTRAMUSCULAR | Status: DC | PRN

## 2013-02-09 MED ORDER — ONDANSETRON HCL 4 MG/2ML IJ SOLN
INTRAMUSCULAR | Status: AC
  Filled 2013-02-09: qty 2

## 2013-02-09 MED ORDER — SCOPOLAMINE 1 MG/3DAYS TD PT72
1.0000 | MEDICATED_PATCH | Freq: Once | TRANSDERMAL | Status: DC
  Administered 2013-02-09: 1.5 mg via TRANSDERMAL

## 2013-02-09 MED ORDER — LACTATED RINGERS IV SOLN
INTRAVENOUS | Status: DC | PRN
  Administered 2013-02-09: 01:00:00 via INTRAVENOUS

## 2013-02-09 MED ORDER — METOCLOPRAMIDE HCL 5 MG/ML IJ SOLN: 10.0000 mg | Freq: Three times a day (TID) | INTRAMUSCULAR | Status: DC | PRN

## 2013-02-09 MED ORDER — MORPHINE SULFATE (PF) 0.5 MG/ML IJ SOLN: INTRAMUSCULAR | Status: DC | PRN

## 2013-02-09 MED ORDER — ONDANSETRON HCL 4 MG/2ML IJ SOLN: 4.0000 mg | INTRAMUSCULAR | Status: DC | PRN

## 2013-02-09 MED ORDER — SCOPOLAMINE 1 MG/3DAYS TD PT72
MEDICATED_PATCH | TRANSDERMAL | Status: AC
  Filled 2013-02-09: qty 1

## 2013-02-09 MED ORDER — ONDANSETRON HCL 4 MG/2ML IJ SOLN
INTRAMUSCULAR | Status: DC | PRN
  Administered 2013-02-09: 4 mg via INTRAVENOUS

## 2013-02-09 MED ORDER — MEPERIDINE HCL 25 MG/ML IJ SOLN
INTRAMUSCULAR | Status: DC | PRN
  Administered 2013-02-09: 12.5 mg via INTRAVENOUS

## 2013-02-09 MED ORDER — DIPHENHYDRAMINE HCL 50 MG/ML IJ SOLN: 12.5000 mg | INTRAMUSCULAR | Status: DC | PRN

## 2013-02-09 MED ORDER — KETOROLAC TROMETHAMINE 60 MG/2ML IM SOLN
INTRAMUSCULAR | Status: AC
  Administered 2013-02-09: 60 mg via INTRAMUSCULAR
  Filled 2013-02-09: qty 2

## 2013-02-09 MED ORDER — KETOROLAC TROMETHAMINE 30 MG/ML IJ SOLN: 30.0000 mg | Freq: Four times a day (QID) | INTRAMUSCULAR | Status: AC | PRN

## 2013-02-09 MED ORDER — MEPERIDINE HCL 25 MG/ML IJ SOLN
INTRAMUSCULAR | Status: AC
  Filled 2013-02-09: qty 1

## 2013-02-09 MED ORDER — LANOLIN HYDROUS EX OINT: 1.0000 "application " | TOPICAL_OINTMENT | CUTANEOUS | Status: DC | PRN

## 2013-02-09 MED ORDER — LACTATED RINGERS IV SOLN
INTRAVENOUS | Status: DC | PRN
  Administered 2013-02-09 (×2): via INTRAVENOUS

## 2013-02-09 MED ORDER — TETANUS-DIPHTH-ACELL PERTUSSIS 5-2.5-18.5 LF-MCG/0.5 IM SUSP
0.5000 mL | Freq: Once | INTRAMUSCULAR | Status: AC
  Administered 2013-02-10: 0.5 mL via INTRAMUSCULAR

## 2013-02-09 MED ORDER — MEPERIDINE HCL 25 MG/ML IJ SOLN: 6.2500 mg | INTRAMUSCULAR | Status: DC | PRN

## 2013-02-09 MED ORDER — CEFAZOLIN SODIUM-DEXTROSE 2-3 GM-% IV SOLR
2.0000 g | Freq: Three times a day (TID) | INTRAVENOUS | Status: DC
  Filled 2013-02-09 (×2): qty 50

## 2013-02-09 MED ORDER — HEMOSTATIC AGENTS (NO CHARGE) OPTIME
TOPICAL | Status: DC | PRN
  Administered 2013-02-09: 1 via TOPICAL

## 2013-02-09 MED ORDER — KETOROLAC TROMETHAMINE 60 MG/2ML IM SOLN: 60.0000 mg | Freq: Once | INTRAMUSCULAR | Status: AC | PRN

## 2013-02-09 MED ORDER — PRENATAL MULTIVITAMIN CH
1.0000 | ORAL_TABLET | Freq: Every day | ORAL | Status: DC
  Administered 2013-02-09 – 2013-02-10 (×2): 1 via ORAL
  Filled 2013-02-09 (×3): qty 1

## 2013-02-09 MED ORDER — DIPHENHYDRAMINE HCL 25 MG PO CAPS: 25.0000 mg | ORAL_CAPSULE | Freq: Four times a day (QID) | ORAL | Status: DC | PRN

## 2013-02-09 MED ORDER — SODIUM BICARBONATE 8.4 % IV SOLN
INTRAVENOUS | Status: DC | PRN
  Administered 2013-02-09: 5 mL via EPIDURAL

## 2013-02-09 MED ORDER — PHENYLEPHRINE 40 MCG/ML (10ML) SYRINGE FOR IV PUSH (FOR BLOOD PRESSURE SUPPORT)
PREFILLED_SYRINGE | INTRAVENOUS | Status: AC
  Filled 2013-02-09: qty 5

## 2013-02-09 MED ORDER — NALOXONE HCL 0.4 MG/ML IJ SOLN: 0.4000 mg | INTRAMUSCULAR | Status: DC | PRN

## 2013-02-09 MED ORDER — ONDANSETRON HCL 4 MG PO TABS: 4.0000 mg | ORAL_TABLET | ORAL | Status: DC | PRN

## 2013-02-09 MED ORDER — CEFAZOLIN SODIUM-DEXTROSE 2-3 GM-% IV SOLR
2.0000 g | Freq: Once | INTRAVENOUS | Status: AC
  Administered 2013-02-09: 2 g via INTRAVENOUS
  Filled 2013-02-09: qty 50

## 2013-02-09 MED ORDER — MORPHINE SULFATE (PF) 0.5 MG/ML IJ SOLN
INTRAMUSCULAR | Status: DC | PRN
  Administered 2013-02-09: 4 mg via EPIDURAL

## 2013-02-09 MED ORDER — WITCH HAZEL-GLYCERIN EX PADS: 1.0000 "application " | MEDICATED_PAD | CUTANEOUS | Status: DC | PRN

## 2013-02-09 MED ORDER — ZOLPIDEM TARTRATE 5 MG PO TABS: 5.0000 mg | ORAL_TABLET | Freq: Every evening | ORAL | Status: DC | PRN

## 2013-02-09 MED ORDER — DIBUCAINE 1 % RE OINT: 1.0000 "application " | TOPICAL_OINTMENT | RECTAL | Status: DC | PRN

## 2013-02-09 MED ORDER — NALOXONE HCL 1 MG/ML IJ SOLN
1.0000 ug/kg/h | INTRAVENOUS | Status: DC | PRN
  Filled 2013-02-09: qty 2

## 2013-02-09 MED ORDER — MENTHOL 3 MG MT LOZG: 1.0000 | LOZENGE | OROMUCOSAL | Status: DC | PRN

## 2013-02-09 SURGICAL SUPPLY — 36 items
APL SKNCLS STERI-STRIP NONHPOA (GAUZE/BANDAGES/DRESSINGS) ×1
BENZOIN TINCTURE PRP APPL 2/3 (GAUZE/BANDAGES/DRESSINGS) ×2 IMPLANT
CLAMP CORD UMBIL (MISCELLANEOUS) ×1 IMPLANT
CLOTH BEACON ORANGE TIMEOUT ST (SAFETY) ×2 IMPLANT
CONTAINER PREFILL 10% NBF 15ML (MISCELLANEOUS) IMPLANT
DRAPE LG THREE QUARTER DISP (DRAPES) ×2 IMPLANT
DRSG OPSITE POSTOP 4X10 (GAUZE/BANDAGES/DRESSINGS) ×2 IMPLANT
DURAPREP 26ML APPLICATOR (WOUND CARE) ×2 IMPLANT
ELECT REM PT RETURN 9FT ADLT (ELECTROSURGICAL) ×2
ELECTRODE REM PT RTRN 9FT ADLT (ELECTROSURGICAL) ×1 IMPLANT
EXTRACTOR VACUUM M CUP 4 TUBE (SUCTIONS) IMPLANT
GLOVE BIO SURGEON STRL SZ7.5 (GLOVE) ×2 IMPLANT
GLOVE BIOGEL PI IND STRL 7.5 (GLOVE) ×1 IMPLANT
GLOVE BIOGEL PI INDICATOR 7.5 (GLOVE) ×1
GOWN STRL REIN XL XLG (GOWN DISPOSABLE) ×4 IMPLANT
HEMOSTAT SURGICEL 2X14 (HEMOSTASIS) ×1 IMPLANT
KIT ABG SYR 3ML LUER SLIP (SYRINGE) IMPLANT
NDL HYPO 25X5/8 SAFETYGLIDE (NEEDLE) IMPLANT
NEEDLE HYPO 25X5/8 SAFETYGLIDE (NEEDLE) IMPLANT
NS IRRIG 1000ML POUR BTL (IV SOLUTION) ×2 IMPLANT
PACK C SECTION WH (CUSTOM PROCEDURE TRAY) ×2 IMPLANT
PAD OB MATERNITY 4.3X12.25 (PERSONAL CARE ITEMS) ×2 IMPLANT
RETRACTOR WND ALEXIS 25 LRG (MISCELLANEOUS) ×1 IMPLANT
RTRCTR WOUND ALEXIS 25CM LRG (MISCELLANEOUS) ×2
SPONGE LAP 18X18 X RAY DECT (DISPOSABLE) ×1 IMPLANT
STRIP CLOSURE SKIN 1/2X4 (GAUZE/BANDAGES/DRESSINGS) ×2 IMPLANT
SUT CHROMIC 2 0 CT 1 (SUTURE) ×2 IMPLANT
SUT MNCRL AB 3-0 PS2 27 (SUTURE) ×2 IMPLANT
SUT PLAIN 0 NONE (SUTURE) IMPLANT
SUT PLAIN 2 0 XLH (SUTURE) ×2 IMPLANT
SUT VIC AB 0 CT1 36 (SUTURE) ×2 IMPLANT
SUT VIC AB 0 CTX 36 (SUTURE) ×12
SUT VIC AB 0 CTX36XBRD ANBCTRL (SUTURE) ×3 IMPLANT
TOWEL OR 17X24 6PK STRL BLUE (TOWEL DISPOSABLE) ×6 IMPLANT
TRAY FOLEY CATH 14FR (SET/KITS/TRAYS/PACK) ×1 IMPLANT
WATER STERILE IRR 1000ML POUR (IV SOLUTION) ×2 IMPLANT

## 2013-02-09 NOTE — Anesthesia Postprocedure Evaluation (Signed)
  Anesthesia Post-op Note  Patient: Angela Graham  Procedure(s) Performed: Procedure(s): Primary CESAREAN SECTION of baby girl at 70 APGAR 7/9 (N/A)  Patient Location: PACU and Mother/Baby  Anesthesia Type:Epidural  Level of Consciousness: awake, alert , oriented and patient cooperative  Airway and Oxygen Therapy: Patient Spontanous Breathing  Post-op Pain: none  Post-op Assessment: Post-op Vital signs reviewed  Post-op Vital Signs: Reviewed and stable  Complications: No apparent anesthesia complications

## 2013-02-09 NOTE — Plan of Care (Signed)
Problem: Phase I Progression Outcomes Goal: Pain controlled with appropriate interventions Outcome: Completed/Met Date Met:  02/09/13 Epidural still working at this time Goal: Foley catheter patent Outcome: Completed/Met Date Met:  02/09/13 Draining pink to red urine Goal: IS, TCDB as ordered Outcome: Completed/Met Date Met:  02/09/13 Can get I/S up to 2500

## 2013-02-09 NOTE — OR Nursing (Signed)
75 ml blood loss during fundal massage by DLWegner RN, cord blood x 2 to OR desk

## 2013-02-09 NOTE — Progress Notes (Addendum)
Patient ID: Angela Graham, female   DOB: [birth year], 37 y.o.   MRN: [id number] Angela Graham is a 37 y.o. G3P0020 at [redacted]w[redacted]d admitted for IOL   Subjective: Denies any pain or pressure   Objective: BP 135/79  Pulse 101  Temp(Src) 99.3 F (37.4 C) (Oral)  Resp 18  Ht 5\' 6"  (1.676 m)  Wt 207 lb (93.895 kg)  BMI 33.43 kg/m2  LMP 05/03/2012     FHT:  FHR: 170 bpm, variability: minimal ,  accelerations:  Abscent,  decelerations:  Present late,  UC:   regular, every 3-4 minutes SVE:   Dilation: 10 Effacement (%): 100 Station: 0 Exam by:: S Lillard, CNM  Significant caput noted Actively pushing x1hr with some progress Last 15 min, resting through ctx late decels improving, +scalp stim   Assessment / Plan: FHR decels   Labor: 2nd stage x2hrs Preeclampsia:  no s/s, BP stable Fetal Wellbeing:  Category II Pain Control:  Epidural Anticipated MOD:  undetermined    Dr Su Hilt updated, viewed FHR tracing remotely, will be en route to hospital, will stop pitocin, continue 02,  Rv'd Dr Su Hilt and briefly discussed r/b proceeding w attempt at vaginal delivery, cesarean section, vacuum extraction    LILLARD,SHELLEY M 02/09/2013, 12:31 AM   Called by SL, CNM to evaluate tracing secondary to decels with pushing.  The patient has had decels for couple of hours now that significantly worsen while on pitocin and pushing.  They are severe, recurrent and late.  There is also fetal tachycardia present and last temp was 99.8 for which pt received unasyn.  On exam the pt is FD and although there is caput to +2, the skull of the head is at zero station.  I discussed the options and recs with the patient and answered any questions she or her husband had which were few.  I rec c/s and pt is agreeable.  Risks, benefits and alternatives were discussed including but not limited to bleeding, infection and injury and consent signed and witnessed.

## 2013-02-09 NOTE — Transfer of Care (Signed)
Immediate Anesthesia Transfer of Care Note  Patient: Angela Graham  Procedure(s) Performed: Procedure(s): Primary CESAREAN SECTION of baby girl at 54 APGAR 7/9 (N/A)  Patient Location: PACU  Anesthesia Type:Epidural  Level of Consciousness: awake, alert  and oriented  Airway & Oxygen Therapy: Patient Spontanous Breathing  Post-op Assessment: Report given to PACU RN and Post -op Vital signs reviewed and stable  Post vital signs: Reviewed and stable  Complications: No apparent anesthesia complications

## 2013-02-09 NOTE — Op Note (Signed)
Cesarean Section Procedure Note  Indications: 40 2/7wks induced secondary to chronic htn and increasing BPs over last several wks of pregnancy with fetal intolerance of labor/second stage and fetal tachycardia  Pre-operative Diagnosis: fetal intolerance to labor and fetal tachycardia   Post-operative Diagnosis: fetal intolerance to labor and fetal tachycardia  Procedure: CESAREAN SECTION  Surgeon: Purcell Nails, MD    Assistants: Sanda Klein, CNM  Anesthesia: Epidural  Anesthesiologist: Dr. Dana Allan  Procedure Details  The patient was taken to the operating room secondary to fetal intolerance of labor after the risks, benefits, complications, treatment options, and expected outcomes were discussed with the patient.  The patient concurred with the proposed plan, giving informed consent which was signed and witnessed. The patient was taken to Operating Room C-Section Suite, identified as Angela Graham and the procedure verified as C-Section Delivery. A Time Out was held and the above information confirmed.  After induction of anesthesia by obtaining a spinal, the patient was prepped and draped in the usual sterile manner. A Pfannenstiel skin incision was made and carried down through the subcutaneous tissue to the underlying layer of fascia.  The fascia was incised bilaterally and extended transversely bilaterally with the Mayo scissors. Kocher clamps were placed on the inferior aspect of the fascial incision and the underlying rectus muscle was separated from the fascia. The same was done on the superior aspect of the fascial incision.  The peritoneum was identified, entered bluntly and extended manually. The utero-vesical peritoneal reflection was incised transversely and the bladder flap was bluntly freed from the lower uterine segment. An Alexis retractor was placed.  A low transverse uterine incision was made with the scalpel and extended bilaterally with the bandage  scissors.  The infant was delivered in vertex presentation in ROP position without difficulty and cord was noted to be low in the uterine cavity.  After the umbilical cord was clamped and cut, the infant was handed to the awaiting pediatricians.  Cord blood was obtained for evaluation.  The placenta was removed intact and appeared to be within normal limits. The uterus was cleared of all clots and debris. There were bilateral uterine extensions at the angles which were repaired with a running interlocking stitch of 0 vicryl and a second imbricating layer.  The primary uterine incision was closed as well with a running interlocking stitch of 0 Vicryl and a second imbricating layer was performed.   Bilateral tubes and ovaries appeared to be within normal limits.  There were a couple of paratubal cysts on the left fallopian tube.  One that was dangling from fimbriated end was excised with the cautery and sent intact to pathology.  The other which was more in the mesosalpinx was left in place.  It was approx. 1cm as well.  Good hemostasis was noted.  Copious irrigation was performed until clear.  The peritoneum was repaired with 2-0 chromic via a running suture.  The fascia was reapproximated with a running suture of 0 Vicryl. The subcutaneous tissue was reapproximated with 3 interrupted sutures of 2-0 plain.  The skin was reapproximated with a subcuticular suture of 3-0 monocryl.  Steristrips were applied.  Instrument, sponge, and needle counts were correct prior to abdominal closure and at the conclusion of the case.  The patient was awaiting transfer to the recovery room in good condition.  Findings: Live female infant with Apgars 7 at one minute and 9 at five minutes.  Normal appearing bilateral ovaries and fallopian tubes were noted.  Estimated Blood Loss:  800 ml         Drains: foley to gravity with 400 ml of urine         Total IV Fluids: 1500 ml         Specimens to Pathology: Placenta and a left  paratubal cyst approx. 1cm         Complications:  None; patient tolerated the procedure well.         Disposition: PACU - hemodynamically stable.         Condition: stable  Attending Attestation: I performed the procedure.

## 2013-02-09 NOTE — Anesthesia Postprocedure Evaluation (Signed)
  Anesthesia Post-op Note  Anesthesia Post Note  Patient: Angela Graham  Procedure(s) Performed: Procedure(s) (LRB): Primary CESAREAN SECTION of baby girl at 44 APGAR 7/9 (N/A)  Anesthesia type: Epidural  Patient location: PACU  Post pain: Pain level controlled  Post assessment: Post-op Vital signs reviewed  Last Vitals:  Filed Vitals:   02/09/13 0459  BP: 162/98  Pulse: 109  Temp: 36.5 C  Resp: 18    Post vital signs: stable  Level of consciousness: awake  Complications: No apparent anesthesia complications

## 2013-02-10 ENCOUNTER — Encounter (HOSPITAL_COMMUNITY): Payer: Self-pay | Admitting: Obstetrics and Gynecology

## 2013-02-10 LAB — CBC
HCT: 25.8 % — ABNORMAL LOW (ref 36.0–46.0)
MCHC: 34.5 g/dL (ref 30.0–36.0)
MCV: 89.6 fL (ref 78.0–100.0)
Platelets: 143 10*3/uL — ABNORMAL LOW (ref 150–400)
RDW: 13.7 % (ref 11.5–15.5)
WBC: 13.8 10*3/uL — ABNORMAL HIGH (ref 4.0–10.5)

## 2013-02-10 NOTE — Progress Notes (Signed)
Angela Graham, CNM, called due to pt still having foley in and pt requesting removal of catheter. Good urine output and urine is clear, yellow. Per CNM, okay to remove foley and let pt shower.

## 2013-02-11 LAB — PROTEIN / CREATININE RATIO, URINE: Creatinine, Urine: 112.51 mg/dL

## 2013-02-11 LAB — URINALYSIS, ROUTINE W REFLEX MICROSCOPIC
Bilirubin Urine: NEGATIVE
Glucose, UA: NEGATIVE mg/dL
Ketones, ur: NEGATIVE mg/dL
Protein, ur: NEGATIVE mg/dL

## 2013-02-11 LAB — URINE MICROSCOPIC-ADD ON

## 2013-02-11 MED ORDER — IBUPROFEN 600 MG PO TABS: 600.0000 mg | ORAL_TABLET | Freq: Four times a day (QID) | ORAL | Status: DC

## 2013-02-11 MED ORDER — OXYCODONE-ACETAMINOPHEN 5-325 MG PO TABS: 1.0000 | ORAL_TABLET | ORAL | Status: DC | PRN

## 2013-02-11 NOTE — Discharge Summary (Signed)
  Cesarean Section Delivery Discharge Summary  Angela Graham  DOB:    12/31/1975 MRN:    [id number] CSN:    [id number]  Date of admission:                  02/07/13  Date of discharge:                   02/11/13  Procedures this admission: C/S for fetal intolerance to labor and fetal tachycardia  Date of Delivery: 02/09/13 by Dr. Su Hilt  Newborn Data:  Live born female  Birth Weight: 7 lb 2.3 oz (3240 g) APGAR: 7, 9  Home with mother. Circumcision Plan: n/a  History of Present Illness:  Angela Graham is a 37 y.o. female, [phone], who presents at [redacted]w[redacted]d weeks gestation. The patient has been followed at the [facility] and Gy[facility]cology division of [facility].    Her pregnancy has been complicated by:  Patient Active Problem List   Diagnosis Date Noted  . Cesarean delivery delivered 02/10/2013  . HTN in pregnancy, chronic 01/29/2013  . Stress fracture of foot 03/28/2011    Hospital course:  The patient was admitted for IOL for [facility].   Her postpartum course was not complicated.  She was discharged to home on postpartum day 2 doing well.  Feeding:  breast  Contraception:  NuvaRing vaginal inserts  Discharge hemoglobin:  Hemoglobin  Date Value Range Status  02/10/2013 8.9* 12.0 - 15.0 g/dL Final     REPEATED TO VERIFY     DELTA CHECK NOTED     HCT  Date Value Range Status  02/10/2013 25.8* 36.0 - 46.0 % Final    Discharge Physical Exam:   General: alert, cooperative and no distress Lochia: appropriate Uterine Fundus: firm Incision: healing well DVT Evaluation: No evidence of DVT seen on physical exam. Negative Homan's sign.  Intrapartum Procedures: cesarean: low cervical, transverse Postpartum Procedures: none Complications-Operative and Postpartum: none  Discharge Diagnoses: Term Pregnancy-delivered  Discharge Information:  Activity:           pelvic rest Diet:                 routine Medications: PNV, Ibuprofen, Iron and Percocet Condition:      stable Instructions:  refer to practice specific booklet Discharge to: home     Haroldine Laws 02/11/2013

## 2013-02-11 NOTE — Progress Notes (Signed)
Labs reviewed. Haroldine Laws, CNM, called to okay pt discharge. Discharge order is in computer.

## 2013-02-11 NOTE — Progress Notes (Signed)
Dr. Stefano Gaul notified of pt refusal of catheter for urine sample. Urine collected clean catch and sent to the lab. Will notify MD of results.

## 2013-02-11 NOTE — Progress Notes (Signed)
Subjective: Postpartum Day 2: Cesarean Delivery Patient reports tolerating PO, + flatus and no problems voiding.  No headaches, blurred vision, or right upper quadrant tenderness.   Objective: Vital signs in last 24 hours: Temp:  [98.8 F (37.1 C)-99 F (37.2 C)] 98.8 F (37.1 C) (06/18 0548) Pulse Rate:  [78-80] 78 (06/18 0548) Resp:  [20] 20 (06/18 0548) BP: (136-143)/(78-87) 143/87 mmHg (06/18 0548) SpO2:  [96 %] 96 % (06/18 0548)  Physical Exam:  General: alert Lochia: appropriate Uterine Fundus: firm Incision: dressing clean and dry DVT Evaluation: No evidence of DVT seen on physical exam. Chest: Clear Heart: Regular rate and rhythm. Reflexes: Normal.   Recent Labs  02/09/13 0345 02/10/13 0620  HGB 11.3* 8.9*  HCT 32.7* 25.8*    Assessment/Plan: Status post Cesarean section. Doing well postoperatively.  The patient will consider discharge today. Nuvaring for contraception. Breast Feeding. Check catheter UA for protein.  Yeiren Whitecotton V 02/11/2013, 11:10 AM

## 2013-02-16 ENCOUNTER — Inpatient Hospital Stay (HOSPITAL_COMMUNITY)
Admission: AD | Admit: 2013-02-16 | Discharge: 2013-02-16 | Disposition: A | Discharge status: Home / Self Care | Payer: PRIVATE HEALTH INSURANCE | Source: Ambulatory Visit | Attending: Obstetrics and Gynecology | Admitting: Obstetrics and Gynecology

## 2013-02-16 ENCOUNTER — Encounter (HOSPITAL_COMMUNITY): Payer: Self-pay | Admitting: *Deleted

## 2013-02-16 DIAGNOSIS — O9229 Other disorders of breast associated with pregnancy and the puerperium: Secondary | ICD-10-CM | POA: Insufficient documentation

## 2013-02-16 DIAGNOSIS — IMO0001 Reserved for inherently not codable concepts without codable children: Secondary | ICD-10-CM | POA: Insufficient documentation

## 2013-02-16 LAB — COMPREHENSIVE METABOLIC PANEL
ALT: 22 U/L (ref 0–35)
CO2: 27 mEq/L (ref 19–32)
Calcium: 9.3 mg/dL (ref 8.4–10.5)
GFR calc Af Amer: >90 mL/min (ref >90)
GFR calc non Af Amer: 84 mL/min — ABNORMAL LOW (ref >90)
Glucose, Bld: 109 mg/dL — ABNORMAL HIGH (ref 70–99)
Sodium: 140 mEq/L (ref 135–145)

## 2013-02-16 LAB — URINALYSIS, ROUTINE W REFLEX MICROSCOPIC
Glucose, UA: NEGATIVE mg/dL
Ketones, ur: NEGATIVE mg/dL
Protein, ur: NEGATIVE mg/dL

## 2013-02-16 LAB — CBC
HCT: 31.6 % — ABNORMAL LOW (ref 36.0–46.0)
Hemoglobin: 10.5 g/dL — ABNORMAL LOW (ref 12.0–15.0)
MCH: 30.1 pg (ref 26.0–34.0)
MCV: 90.5 fL (ref 78.0–100.0)
RBC: 3.49 MIL/uL — ABNORMAL LOW (ref 3.87–5.11)

## 2013-02-16 LAB — URINE MICROSCOPIC-ADD ON

## 2013-02-16 MED ORDER — HYDROCHLOROTHIAZIDE 25 MG PO TABS: 25.0000 mg | ORAL_TABLET | Freq: Every day | ORAL | Status: DC

## 2013-02-16 NOTE — MAU Provider Note (Signed)
History   37 yo G3P1021 at 1 week s/p primary cesarean birth due to fetal intolerance to labor following induction for chronic hypertension presents today after calling the office to report BP 150s/110s and single episode of ? Spots in vision.   She has not been on any medication during her pregnancy.  Also c/o cracked and painful nipples--is breast feeding, currently pumping for breastmilk due to painful nipples.  Patient Active Problem List   Diagnosis Date Noted  . Cesarean delivery delivered 02/10/2013  . HTN in pregnancy, chronic 01/29/2013  . Stress fracture of foot 03/28/2011     OB History   Grav Para Term Preterm Abortions TAB SAB Ect Mult Living   3 1 1  2     1       Past Medical History  Diagnosis Date  . PONV (postoperative nausea and vomiting)   . Headache(784.0)   . Anemia   . Hypertension   . HTN in pregnancy, chronic 01/29/2013    Past Surgical History  Procedure Laterality Date  . Dilation and curettage of uterus      x 2 EAB  . Wisdom tooth extraction    . No past surgeries    . Cesarean section N/A 02/09/2013    Procedure: Primary CESAREAN SECTION of baby girl at 0149 APGAR 7/9;  Surgeon: Purcell Nails, MD;  Location: [facility];  Service: Obstetrics;  Laterality: N/A;    Family History  Problem Relation Age of Onset  . Depression Mother   . Hypertension Mother   . Cancer Father 10    prostate  . Hypertension Brother   . Cancer Paternal Uncle     prostate  . Depression Maternal Grandmother   . Heart disease Maternal Grandmother     CHF  . Hypertension Maternal Grandmother   . Stroke Maternal Grandmother   . Cancer Maternal Grandfather     brain  . Cancer Paternal Grandfather     colon  . Hypertension Brother     History  Substance Use Topics  . Smoking status: Never Smoker   . Smokeless tobacco: Never Used  . Alcohol Use: No     Comment: none since + UPT    Allergies: No Known Allergies  Prescriptions prior to admission  Medication  Sig Dispense Refill  . ibuprofen (ADVIL,MOTRIN) 600 MG tablet Take 1 tablet (600 mg total) by mouth every 6 (six) hours.  30 tablet  0  . IRON PO Take 1 tablet by mouth at bedtime.      Marland Kitchen oxyCODONE-acetaminophen (PERCOCET/ROXICET) 5-325 MG per tablet Take 1-2 tablets by mouth every 4 (four) hours as needed.  30 tablet  0  . Prenatal Vit-Fe Fumarate-FA (PRENATAL MULTIVITAMIN) TABS Take 1 tablet by mouth at bedtime.         Physical Exam   Last menstrual period 05/03/2012, unknown if currently breastfeeding.  Chest clear Heart RRR without murmur Nipples cracked and raw.  No s/s mastitis. Abd NT, incisional WNL--steri strips intact. +bowel sounds. Pelvic--deferred Ext--DTR 1+, no clonus.  1+ edema on left, tr/1+ on right.     ED Course  1 week post-op C/S Chronic hypertension--r/o pp pre-eclampsia Nipple trauma  Plan: PIH labs Protein/creatnine ratio and UA Serial BPs Will consult with lactation regarding option of nipple shields use.   Nigel Bridgeman CNM, MN 02/16/2013 1:57 PM  Addendum:  Ceasar Mons Vitals:   02/16/13 1513 02/16/13 1520 02/16/13 1630 02/16/13 1635  BP: 134/97 147/92  138/99  Pulse: 91  90  86  Temp:      TempSrc:      Resp:      Height:   5\' 6"  (1.676 m)   Weight:   191 lb 6.4 oz (86.818 kg)   SpO2:       Weight prior to delivery was 207--no post-delivery values noted in chart.  Results for orders placed during the hospital encounter of 02/16/13 (from the past 24 hour(s))  CBC     Status: Abnormal   Collection Time    02/16/13  2:02 PM      Result Value Range   WBC 7.5  4.0 - 10.5 K/uL   RBC 3.49 (*) 3.87 - 5.11 MIL/uL   Hemoglobin 10.5 (*) 12.0 - 15.0 g/dL   HCT 78.2 (*) 95.6 - 21.3 %   MCV 90.5  78.0 - 100.0 fL   MCH 30.1  26.0 - 34.0 pg   MCHC 33.2  30.0 - 36.0 g/dL   RDW 08.6  57.8 - 46.9 %   Platelets 361  150 - 400 K/uL  COMPREHENSIVE METABOLIC PANEL     Status: Abnormal   Collection Time    02/16/13  2:02 PM      Result Value Range    Sodium 140  135 - 145 mEq/L   Potassium 3.5  3.5 - 5.1 mEq/L   Chloride 104  96 - 112 mEq/L   CO2 27  19 - 32 mEq/L   Glucose, Bld 109 (*) 70 - 99 mg/dL   BUN 11  6 - 23 mg/dL   Creatinine, Ser 6.29  0.50 - 1.10 mg/dL   Calcium 9.3  8.4 - 52.8 mg/dL   Total Protein 6.6  6.0 - 8.3 g/dL   Albumin 2.8 (*) 3.5 - 5.2 g/dL   AST 31  0 - 37 U/L   ALT 22  0 - 35 U/L   Alkaline Phosphatase 123 (*) 39 - 117 U/L   Total Bilirubin 0.2 (*) 0.3 - 1.2 mg/dL   GFR calc non Af Amer 84 (*) >90 mL/min   GFR calc Af Amer >90  >90 mL/min  LACTATE DEHYDROGENASE     Status: None   Collection Time    02/16/13  2:02 PM      Result Value Range   LDH 242  94 - 250 U/L  URIC ACID     Status: None   Collection Time    02/16/13  2:02 PM      Result Value Range   Uric Acid, Serum 5.9  2.4 - 7.0 mg/dL  URINALYSIS, ROUTINE W REFLEX MICROSCOPIC     Status: Abnormal   Collection Time    02/16/13  2:30 PM      Result Value Range   Color, Urine YELLOW  YELLOW   APPearance CLEAR  CLEAR   Specific Gravity, Urine 1.020  1.005 - 1.030   pH 5.5  5.0 - 8.0   Glucose, UA NEGATIVE  NEGATIVE mg/dL   Hgb urine dipstick LARGE (*) NEGATIVE   Bilirubin Urine NEGATIVE  NEGATIVE   Ketones, ur NEGATIVE  NEGATIVE mg/dL   Protein, ur NEGATIVE  NEGATIVE mg/dL   Urobilinogen, UA 0.2  0.0 - 1.0 mg/dL   Nitrite NEGATIVE  NEGATIVE   Leukocytes, UA SMALL (*) NEGATIVE  URINE MICROSCOPIC-ADD ON     Status: Abnormal   Collection Time    02/16/13  2:30 PM      Result Value Range   Squamous Epithelial /  LPF FEW (*) RARE   WBC, UA 0-2  <3 WBC/hpf   RBC / HPF 0-2  <3 RBC/hpf   Bacteria, UA FEW (*) RARE   Urine-Other MUCOUS PRESENT     Consulted with Dr. Pennie Rushing. Consulted with lactation specialist, Cordelia Pen. D/C'd home. Rx HCTZ 25 mg po q day x 7 days. Rx Dr. Allene Pyo Nipple Cream Both Rxs sent to [facility]. Recommended patient make outpatient appt with Lactation Service for fitting of nipple shields. She is to  monitor her BP daily and to call on Friday with report--per Dr. Pennie Rushing, if systolic is persistently > 150 or diastolic > 90, may need to add Procardia XL 30 mg. PIH precautions reviewed. Will f/u on protein/creatnine ratio results.  Nigel Bridgeman, CNM 02/16/13 5p

## 2013-02-16 NOTE — MAU Note (Signed)
Patient states she had a primary cesarean on 6-16. Had HTN prior to the pregnancy. Took blood pressure at home and was 163/101, and was seeing spots this am.

## 2013-02-17 ENCOUNTER — Other Ambulatory Visit: Payer: Self-pay | Admitting: Obstetrics and Gynecology

## 2013-02-17 LAB — PROTEIN / CREATININE RATIO, URINE
Creatinine, Urine: 218.89 mg/dL
Protein Creatinine Ratio: 0.06 (ref 0.00–0.15)
Total Protein, Urine: 13.8 mg/dL

## 2013-07-02 ENCOUNTER — Other Ambulatory Visit: Payer: Self-pay

## 2013-12-29 LAB — OB RESULTS CONSOLE HEPATITIS B SURFACE ANTIGEN: Hepatitis B Surface Ag: NEGATIVE

## 2013-12-29 LAB — OB RESULTS CONSOLE HIV ANTIBODY (ROUTINE TESTING): HIV: NONREACTIVE

## 2013-12-29 LAB — OB RESULTS CONSOLE ABO/RH: "RH Type ": POSITIVE

## 2013-12-29 LAB — OB RESULTS CONSOLE RUBELLA ANTIBODY, IGM: Rubella: IMMUNE

## 2013-12-29 LAB — OB RESULTS CONSOLE RPR: RPR: NONREACTIVE

## 2014-02-20 ENCOUNTER — Observation Stay (HOSPITAL_COMMUNITY)
Admission: AD | Admit: 2014-02-20 | Discharge: 2014-02-21 | Disposition: A | Discharge status: Home / Self Care | Payer: BC Managed Care – PPO | Source: Ambulatory Visit | Attending: Obstetrics & Gynecology | Admitting: Obstetrics & Gynecology

## 2014-02-20 DIAGNOSIS — O4410 Placenta previa with hemorrhage, unspecified trimester: Principal | ICD-10-CM | POA: Insufficient documentation

## 2014-02-20 DIAGNOSIS — O4402 Placenta previa specified as without hemorrhage, second trimester: Secondary | ICD-10-CM

## 2014-02-20 DIAGNOSIS — Z79899 Other long term (current) drug therapy: Secondary | ICD-10-CM | POA: Insufficient documentation

## 2014-02-20 DIAGNOSIS — O10019 Pre-existing essential hypertension complicating pregnancy, unspecified trimester: Secondary | ICD-10-CM | POA: Insufficient documentation

## 2014-02-20 DIAGNOSIS — O4692 Antepartum hemorrhage, unspecified, second trimester: Secondary | ICD-10-CM | POA: Diagnosis present

## 2014-02-21 ENCOUNTER — Inpatient Hospital Stay (HOSPITAL_COMMUNITY): Payer: BC Managed Care – PPO

## 2014-02-21 ENCOUNTER — Encounter (HOSPITAL_COMMUNITY): Payer: Self-pay | Admitting: *Deleted

## 2014-02-21 DIAGNOSIS — O10019 Pre-existing essential hypertension complicating pregnancy, unspecified trimester: Secondary | ICD-10-CM | POA: Diagnosis not present

## 2014-02-21 DIAGNOSIS — O4410 Placenta previa with hemorrhage, unspecified trimester: Secondary | ICD-10-CM | POA: Diagnosis not present

## 2014-02-21 DIAGNOSIS — Z79899 Other long term (current) drug therapy: Secondary | ICD-10-CM | POA: Diagnosis not present

## 2014-02-21 DIAGNOSIS — O4692 Antepartum hemorrhage, unspecified, second trimester: Secondary | ICD-10-CM | POA: Diagnosis present

## 2014-02-21 DIAGNOSIS — O209 Hemorrhage in early pregnancy, unspecified: Secondary | ICD-10-CM | POA: Diagnosis present

## 2014-02-21 LAB — CBC
HEMATOCRIT: 32.3 % — AB (ref 36.0–46.0)
HEMOGLOBIN: 11 g/dL — AB (ref 12.0–15.0)
MCH: 30.8 pg (ref 26.0–34.0)
MCHC: 34.1 g/dL (ref 30.0–36.0)
MCV: 90.5 fL (ref 78.0–100.0)
Platelets: 218 10*3/uL (ref 150–400)
RBC: 3.57 MIL/uL — AB (ref 3.87–5.11)
RDW: 12.8 % (ref 11.5–15.5)
WBC: 9.9 10*3/uL (ref 4.0–10.5)

## 2014-02-21 MED ORDER — PRENATAL MULTIVITAMIN CH
1.0000 | ORAL_TABLET | Freq: Every day | ORAL | Status: DC
Start: 2014-02-21 — End: 2014-02-21
  Filled 2014-02-21: qty 1

## 2014-02-21 MED ORDER — ACETAMINOPHEN 325 MG PO TABS: 650.0000 mg | ORAL_TABLET | ORAL | Status: DC | PRN

## 2014-02-21 MED ORDER — CALCIUM CARBONATE ANTACID 500 MG PO CHEW
2.0000 | CHEWABLE_TABLET | ORAL | Status: DC | PRN
  Filled 2014-02-21: qty 2

## 2014-02-21 MED ORDER — ZOLPIDEM TARTRATE 5 MG PO TABS
5.0000 mg | ORAL_TABLET | Freq: Every evening | ORAL | Status: DC | PRN
Start: 2014-02-21 — End: 2014-02-21

## 2014-02-21 MED ORDER — DOCUSATE SODIUM 100 MG PO CAPS
100.0000 mg | ORAL_CAPSULE | Freq: Every day | ORAL | Status: DC
  Filled 2014-02-21: qty 1

## 2014-02-21 NOTE — MAU Provider Note (Signed)
History     CSN: [id number]  Arrival date and time: 02/20/14 2353   First Provider Initiated Contact with Patient 02/21/14 0025      No chief complaint on file.  HPI  Angela Graham is a 38 y.o. [phone] at 553w0d who presents today with vaginal bleeding. She states that she was having intercourse, and the bleeding started immediatly after. She states that it was " a lot of blood", and she had two clots that she passed in the toilet. She denies any pain at this time. She reports a hx fo CHTN, but she is not on any meds. She states that she was told she had an extra piece of her placenta at the last ultrasound, but she was not told that she couldn't have intercourse.   Past Medical History  Diagnosis Date  . PONV (postoperative nausea and vomiting)   . Headache(784.0)   . Anemia   . Hypertension   . HTN in pregnancy, chronic 01/29/2013    Past Surgical History  Procedure Laterality Date  . Dilation and curettage of uterus      x 2 EAB  . Wisdom tooth extraction    . No past surgeries    . Cesarean section N/A 02/09/2013    Procedure: Primary CESAREAN SECTION of baby girl at 0149 APGAR 7/9;  Surgeon: Purcell NailsAngela Y Roberts, MD;  Location: [facility];  Service: Obstetrics;  Laterality: N/A;    Family History  Problem Relation Age of Onset  . Depression Mother   . Hypertension Mother   . Cancer Father 259    prostate  . Hypertension Brother   . Cancer Paternal Uncle     prostate  . Depression Maternal Grandmother   . Heart disease Maternal Grandmother     CHF  . Hypertension Maternal Grandmother   . Stroke Maternal Grandmother   . Cancer Maternal Grandfather     brain  . Cancer Paternal Grandfather     colon  . Hypertension Brother     History  Substance Use Topics  . Smoking status: Never Smoker   . Smokeless tobacco: Never Used  . Alcohol Use: No     Comment: none since + UPT    Allergies: No Known Allergies  Prescriptions prior to admission  Medication Sig  Dispense Refill  . Prenatal Vit-Fe Fumarate-FA (PRENATAL MULTIVITAMIN) TABS Take 1 tablet by mouth at bedtime.      . docusate sodium (COLACE) 100 MG capsule Take 100 mg by mouth 2 (two) times daily.      . hydrochlorothiazide (HYDRODIURIL) 25 MG tablet Take 1 tablet (25 mg total) by mouth daily.  7 tablet  0  . ibuprofen (ADVIL,MOTRIN) 600 MG tablet Take 1 tablet (600 mg total) by mouth every 6 (six) hours.  30 tablet  0  . IRON PO Take 1 tablet by mouth at bedtime.      Marland Kitchen. oxyCODONE-acetaminophen (PERCOCET/ROXICET) 5-325 MG per tablet Take 1-2 tablets by mouth every 4 (four) hours as needed.  30 tablet  0    ROS Physical Exam   Blood pressure 155/95, pulse 91, temperature 98.3 F (36.8 C), temperature source Oral, resp. rate 20, SpO2 100.00%, unknown if currently breastfeeding.  Physical Exam  Nursing note and vitals reviewed. Constitutional: She is oriented to person, place, and time. She appears well-developed and well-nourished. No distress.  Cardiovascular: Normal rate.   Respiratory: Effort normal.  GI: Soft. There is no tenderness.  Genitourinary:   External: no lesion Vagina:  large amount of blood and clot in the vagina  Cervix: unable to visualize 2/2 blood  Uterus: AGA, FHT with doppler   Neurological: She is alert and oriented to person, place, and time.  Skin: Skin is warm and dry.  Psychiatric: She has a normal mood and affect.    MAU Course  Procedures  Results for orders placed during the hospital encounter of 02/20/14 (from the past 24 hour(s))  CBC     Status: Abnormal   Collection Time    02/21/14  1:15 AM      Result Value Ref Range   WBC 9.9  4.0 - 10.5 K/uL   RBC 3.57 (*) 3.87 - 5.11 MIL/uL   Hemoglobin 11.0 (*) 12.0 - 15.0 g/dL   HCT 02.732.3 (*) 25.336.0 - 66.446.0 %   MCV 90.5  78.0 - 100.0 fL   MCH 30.8  26.0 - 34.0 pg   MCHC 34.1  30.0 - 36.0 g/dL   RDW 40.312.8  47.411.5 - 25.915.5 %   Platelets 218  150 - 400 K/uL   US: shows placenta previa 0138: D/W Dr.  Langston MaskerMorris, plan to OBS the patient overnight tonight.  0142: Patient is agreeable to staying overnight   Assessment and Plan  Placenta previa with bleeding in the 2nd trimester  Observation Consider DC in the morning if bleeding is stable/decreased   Tawnya CrookHogan, Heather Donovan 02/21/2014, 12:26 AM

## 2014-02-21 NOTE — Discharge Instructions (Signed)
Call Office early this week for recheck in office. Placenta Previa  Placenta previa is a condition in pregnant women where the placenta implants in the lower part of the uterus. The placenta either partially or completely covers the opening to the cervix. This is a problem because the baby must pass through the cervix during delivery. There are three types of placenta previa. They include:  1. Marginal placenta previa. The placenta is near the cervix, but does not cover the opening. 2. Partial placenta previa. The placenta covers part of the cervical opening. 3. Complete placenta previa. The placenta covers the entire cervical opening.  Depending on the type of placenta previa, there is a chance the placenta may move into a normal position and no longer cover the cervix as the pregnancy progresses. It is important to keep all prenatal visits with your caregiver.  RISK FACTORS You may be more likely to develop placenta previa if you:   Are carrying more than one baby (multiples).   Have an abnormally shaped uterus.   Have scars on the lining of the uterus.   Had previous surgeries involving the uterus, such as a cesarean delivery.   Have delivered a baby previously.   Have a history of placenta previa.   Have smoked or used cocaine during pregnancy.   Are age 36 or older during pregnancy.  SYMPTOMS The main symptom of placenta previa is sudden, painless vaginal bleeding during the second half of pregnancy. The amount of bleeding can be light to very heavy. The bleeding may stop on its own, but almost always returns. Cramping, regular contractions, abdominal pain, and lower back pain can also occur with placenta previa.  DIAGNOSIS Placenta previa can be diagnosed through an ultrasound by finding where the placenta is located. The ultrasound may find placenta previa either during a routine prenatal visit or after vaginal bleeding is noticed. If you are diagnosed with placenta  previa, your caregiver may avoid vaginal exams to reduce the risk of heavy bleeding. There is a chance that placenta previa may not be diagnosed until bleeding occurs during labor.  TREATMENT Specific treatment depends on:   How much you are bleeding or if the bleeding has stopped.  How far along you are in your pregnancy.   The condition of the baby.   The location of the baby and placenta.   The type of placenta previa.  Depending on the factors above, your caregiver may recommend:   Decreased activity.   Bed rest at home or in the hospital.  Pelvic rest. This means no sex, using tampons, douching, pelvic exams, or placing anything into the vagina.  A blood transfusion to replace maternal blood loss.  A cesarean delivery if the bleeding is heavy and cannot be controlled or the placenta completely covers the cervix.  Medication to stop premature labor or mature the fetal lungs if delivery is needed before the pregnancy is full term.  WHEN SHOULD YOU SEEK IMMEDIATE MEDICAL CARE IF YOU ARE SENT HOME WITH PLACENTA PREVIA? Seek immediate medical care if you show any symptoms of placenta previa. You will need to go to the hospital to get checked immediately. Again, those symptoms are:  Sudden, painless vaginal bleeding, even a small amount.  Cramping or regular contractions.  Lower back or abdominal pain. Document Released: 08/13/2005 Document Revised: 04/15/2013 Document Reviewed: 11/14/2012 [facility] Patient Information 2015 [place], [place]. This information is not intended to replace advice given to you by your health care provider. Make sure  you discuss any questions you have with your health care provider.  Pelvic Rest Pelvic rest is sometimes recommended for women when:   The placenta is partially or completely covering the opening of the cervix (placenta previa).  There is bleeding between the uterine wall and the amniotic sac in the first trimester (subchorionic  hemorrhage).  The cervix begins to open without labor starting (incompetent cervix, cervical insufficiency).  The labor is too early (preterm labor). HOME CARE INSTRUCTIONS  Do not have sexual intercourse, stimulation, or an orgasm.  Do not use tampons, douche, or put anything in the vagina.  Do not lift anything over 10 pounds (4.5 kg).  Avoid strenuous activity or straining your pelvic muscles. SEEK MEDICAL CARE IF:  You have any vaginal bleeding during pregnancy. Treat this as a potential emergency.  You have cramping pain felt low in the stomach (stronger than menstrual cramps).  You notice vaginal discharge (watery, mucus, or bloody).  You have a low, dull backache.  There are regular contractions or uterine tightening. SEEK IMMEDIATE MEDICAL CARE IF: You have vaginal bleeding and have placenta previa.  Document Released: 12/08/2010 Document Revised: 11/05/2011 Document Reviewed: 12/08/2010 [facility] Patient Information 2015 [place], [place]. This information is not intended to replace advice given to you by your health care provider. Make sure you discuss any questions you have with your health care provider.

## 2014-02-21 NOTE — MAU Note (Signed)
Pt. Started to bleed tonight after intercourse. Went to the restroom and passed two blood clots. Denies cramping or abdominal pain. Wearing a pad and it has a scant amount of blood on it. Pt. States she had an anatomy scan last week and the dr. Catalina Pizzaold her the placenta had an extra part to it. HTN when not pregnant. Not currently on medications.

## 2014-03-22 ENCOUNTER — Inpatient Hospital Stay (HOSPITAL_COMMUNITY): Payer: BC Managed Care – PPO

## 2014-03-22 ENCOUNTER — Encounter (HOSPITAL_COMMUNITY): Payer: Self-pay | Admitting: *Deleted

## 2014-03-22 ENCOUNTER — Inpatient Hospital Stay (HOSPITAL_COMMUNITY)
Admission: AD | Admit: 2014-03-22 | Discharge: 2014-03-22 | Disposition: A | Discharge status: Home / Self Care | Payer: BC Managed Care – PPO | Source: Ambulatory Visit | Attending: Obstetrics and Gynecology | Admitting: Obstetrics and Gynecology

## 2014-03-22 DIAGNOSIS — O4690 Antepartum hemorrhage, unspecified, unspecified trimester: Secondary | ICD-10-CM

## 2014-03-22 DIAGNOSIS — O10019 Pre-existing essential hypertension complicating pregnancy, unspecified trimester: Secondary | ICD-10-CM | POA: Insufficient documentation

## 2014-03-22 NOTE — MAU Provider Note (Signed)
History     CSN: [id number]  Arrival date and time: 03/22/14 [id number]   First Provider Initiated Contact with Patient 03/22/14 [phone]      Chief Complaint  Patient presents with  . Vaginal Bleeding   HPI Comments: Angela Graham 5148w1d [id number] 2848w1d presents to MAU with vaginal bleeding and golf ball size clot that started this morning at 8 am. She has a known complete previa. She denies any pain. Blood type is A+  Vaginal Bleeding      Past Medical History  Diagnosis Date  . PONV (postoperative nausea and vomiting)   . Headache(784.0)   . Anemia   . Hypertension   . HTN in pregnancy, chronic 01/29/2013    Past Surgical History  Procedure Laterality Date  . Dilation and curettage of uterus      x 2 EAB  . Wisdom tooth extraction    . Cesarean section N/A 02/09/2013    Procedure: Primary CESAREAN SECTION of baby girl at 0149 APGAR 7/9;  Surgeon: Purcell NailsAngela Y Roberts, MD;  Location: [facility];  Service: Obstetrics;  Laterality: N/A;    Family History  Problem Relation Age of Onset  . Depression Mother   . Hypertension Mother   . Cancer Father [id number]    prostate  . Hypertension Brother   . Cancer Paternal Uncle     prostate  . Depression Maternal Grandmother   . Heart disease Maternal Grandmother     CHF  . Hypertension Maternal Grandmother   . Stroke Maternal Grandmother   . Cancer Maternal Grandfather     brain  . Cancer Paternal Grandfather     colon  . Hypertension Brother     History  Substance Use Topics  . Smoking status: Never Smoker   . Smokeless tobacco: Never Used  . Alcohol Use: No     Comment: none since + UPT    Allergies: No Known Allergies  Prescriptions prior to admission  Medication Sig Dispense Refill  . calcium carbonate (TUMS - DOSED IN MG ELEMENTAL CALCIUM) 500 MG chewable tablet Chew 2 tablets by mouth daily as needed for indigestion or heartburn.      . Prenatal Vit-Min-FA-Fish Oil ([employer] PRENATAL GUMMY PO) Take 2 tablets by mouth  daily.        Review of Systems  Constitutional: Negative.   Cardiovascular: Negative.   Genitourinary: Positive for vaginal bleeding.       Vaginal bleeding and clots  Musculoskeletal: Negative.   Skin: Negative.   Neurological: Negative.   Psychiatric/Behavioral: Negative.    Physical Exam   Blood pressure 119/76, pulse 88, temperature 98.3 F (36.8 C), temperature source Oral, resp. rate 16, height 5\' 6"  (1.676 m), weight 85.73 kg (189 lb), unknown if currently breastfeeding.  Physical Exam  Constitutional: She is oriented to person, place, and time. She appears well-developed and well-nourished. No distress.  HENT:  Head: Normocephalic.  Eyes: Pupils are equal, round, and reactive to light.  Cardiovascular: Normal rate, regular rhythm and normal heart sounds.   Respiratory: Effort normal and breath sounds normal. No respiratory distress.  GI: Soft. Bowel sounds are normal. She exhibits no distension. There is no tenderness. There is no rebound.  Genitourinary:  Not examined  Musculoskeletal: Normal range of motion.  Neurological: She is alert and oriented to person, place, and time.  Skin: Skin is warm and dry.  Psychiatric: She has a normal mood and affect. Her behavior is normal. Judgment and thought content normal.  MAU Course  Procedures  MDM Spoke with Dr Vincente Poli who advised U/S  Spoke with Dr Vincente Poli who advised if more bleeding she would be admitted for 24 hour observation Pt has had 2 episodes of bleeding since this am including after ultrasound when it went into toilet and again after getting up and walking around MAU. She has also had 2 contractions 11 minutes apart  Assessment and Plan   A: Bleeding in second trimester  P: Admit for 24 hour ops  Carolynn Serve 03/22/2014, 9:52 AM

## 2014-03-22 NOTE — MAU Note (Signed)
Known previa.  Initial bleed was 2 wks ago, has been brownish since.  Passes a small clot this morning then started having bright red bleeding.  Denies pain.  +FM

## 2014-03-22 NOTE — Discharge Instructions (Signed)
Vaginal Bleeding During Pregnancy, Second Trimester °A small amount of bleeding (spotting) from the vagina is relatively common in pregnancy. It usually stops on its own. Various things can cause bleeding or spotting in pregnancy. Some bleeding may be related to the pregnancy, and some may not. Sometimes the bleeding is normal and is not a problem. However, bleeding can also be a sign of something serious. Be sure to tell your health care provider about any vaginal bleeding right away. °Some possible causes of vaginal bleeding during the second trimester include: °· Infection, inflammation, or growths on the cervix.   °· The placenta may be partially or completely covering the opening of the cervix inside the uterus (placenta previa). °· The placenta may have separated from the uterus (abruption of the placenta).   °· You may be having early (preterm) labor.   °· The cervix may not be strong enough to keep a baby inside the uterus (cervical insufficiency).   °· Tiny cysts may have developed in the uterus instead of pregnancy tissue (molar pregnancy).  °HOME CARE INSTRUCTIONS  °Watch your condition for any changes. The following actions may help to lessen any discomfort you are feeling: °· Follow your health care provider's instructions for limiting your activity. If your health care provider orders bed rest, you may need to stay in bed and only get up to use the bathroom. However, your health care provider may allow you to continue light activity. °· If needed, make plans for someone to help with your regular activities and responsibilities while you are on bed rest. °· Keep track of the number of pads you use each day, how often you change pads, and how soaked (saturated) they are. Write this down. °· Do not use tampons. Do not douche. °· Do not have sexual intercourse or orgasms until approved by your health care provider. °· If you pass any tissue from your vagina, save the tissue so you can show it to your  health care provider. °· Only take over-the-counter or prescription medicines as directed by your health care provider. °· Do not take aspirin because it can make you bleed. °· Do not exercise or perform any strenuous activities or heavy lifting without your health care provider's permission. °· Keep all follow-up appointments as directed by your health care provider. °SEEK MEDICAL CARE IF: °· You have any vaginal bleeding during any part of your pregnancy. °· You have cramps or labor pains. °· You have a fever, not controlled by medicine. °SEEK IMMEDIATE MEDICAL CARE IF:  °· You have severe cramps in your back or belly (abdomen). °· You have contractions. °· You have chills. °· You pass large clots or tissue from your vagina. °· Your bleeding increases. °· You feel light-headed or weak, or you have fainting episodes. °· You are leaking fluid or have a gush of fluid from your vagina. °MAKE SURE YOU: °· Understand these instructions. °· Will watch your condition. °· Will get help right away if you are not doing well or get worse. °Document Released: 05/23/2005 Document Revised: 08/18/2013 Document Reviewed: 04/20/2013 °ExitCare® Patient Information ©2015 [employer], LLC. This information is not intended to replace advice given to you by your health care provider. Make sure you discuss any questions you have with your health care provider. ° °

## 2014-03-22 NOTE — Progress Notes (Signed)
38 year old G 4 P 1021 at 1225 w 1 day presents with 1 episode of passage of golf ball sized clot at 0800. No more clotting since then. Just scant spotting with wiping. Denies any pain or contractions. Had a placenta previa earlier in the pregnancy. The patient has been in triage for over 4 hours - no major bleeding noted. Ultrasound performed showed a posterior placenta. No previa or retroplacental clot noted. Cervix long on ultrasound FHR has been fine No contractions on tocometer Abdomen is soft and non tender  I discussed with patient 2 options 1. Observation in hospital overnight 2. Discharge home in with bedrest and follow up in office on Wednesday [place] on bedrest.

## 2014-04-03 ENCOUNTER — Inpatient Hospital Stay (HOSPITAL_COMMUNITY): Payer: BC Managed Care – PPO

## 2014-04-03 ENCOUNTER — Encounter (HOSPITAL_COMMUNITY): Payer: Self-pay | Admitting: *Deleted

## 2014-04-03 ENCOUNTER — Inpatient Hospital Stay (HOSPITAL_COMMUNITY)
Admission: AD | Admit: 2014-04-03 | Discharge: 2014-04-16 | DRG: 782 | Disposition: A | Discharge status: Home / Self Care | Payer: BC Managed Care – PPO | Source: Ambulatory Visit | Attending: Obstetrics & Gynecology | Admitting: Obstetrics & Gynecology

## 2014-04-03 DIAGNOSIS — O42919 Preterm premature rupture of membranes, unspecified as to length of time between rupture and onset of labor, unspecified trimester: Secondary | ICD-10-CM

## 2014-04-03 DIAGNOSIS — O4693 Antepartum hemorrhage, unspecified, third trimester: Secondary | ICD-10-CM

## 2014-04-03 DIAGNOSIS — O4590 Premature separation of placenta, unspecified, unspecified trimester: Secondary | ICD-10-CM | POA: Diagnosis present

## 2014-04-03 DIAGNOSIS — O34219 Maternal care for unspecified type scar from previous cesarean delivery: Secondary | ICD-10-CM | POA: Diagnosis present

## 2014-04-03 DIAGNOSIS — O4690 Antepartum hemorrhage, unspecified, unspecified trimester: Secondary | ICD-10-CM | POA: Diagnosis present

## 2014-04-03 DIAGNOSIS — O4410 Placenta previa with hemorrhage, unspecified trimester: Secondary | ICD-10-CM | POA: Diagnosis present

## 2014-04-03 DIAGNOSIS — O4402 Placenta previa specified as without hemorrhage, second trimester: Secondary | ICD-10-CM

## 2014-04-03 DIAGNOSIS — O09529 Supervision of elderly multigravida, unspecified trimester: Secondary | ICD-10-CM | POA: Diagnosis present

## 2014-04-03 DIAGNOSIS — O4692 Antepartum hemorrhage, unspecified, second trimester: Secondary | ICD-10-CM

## 2014-04-03 DIAGNOSIS — O4290 Premature rupture of membranes, unspecified as to length of time between rupture and onset of labor, unspecified weeks of gestation: Secondary | ICD-10-CM | POA: Diagnosis not present

## 2014-04-03 LAB — CBC
HCT: 30.6 % — ABNORMAL LOW (ref 36.0–46.0)
Hemoglobin: 10.4 g/dL — ABNORMAL LOW (ref 12.0–15.0)
MCH: 31 pg (ref 26.0–34.0)
MCHC: 34 g/dL (ref 30.0–36.0)
MCV: 91.3 fL (ref 78.0–100.0)
Platelets: 205 10*3/uL (ref 150–400)
RBC: 3.35 MIL/uL — AB (ref 3.87–5.11)
RDW: 12.9 % (ref 11.5–15.5)
WBC: 9.9 10*3/uL (ref 4.0–10.5)

## 2014-04-03 LAB — TYPE AND SCREEN
ABO/RH(D): A POS
ANTIBODY SCREEN: NEGATIVE

## 2014-04-03 LAB — SAMPLE TO BLOOD BANK

## 2014-04-03 LAB — ABO/RH: ABO/RH(D): A POS

## 2014-04-03 MED ORDER — BETAMETHASONE SOD PHOS & ACET 6 (3-3) MG/ML IJ SUSP
12.0000 mg | INTRAMUSCULAR | Status: AC
  Administered 2014-04-03 – 2014-04-04 (×2): 12 mg via INTRAMUSCULAR
  Filled 2014-04-03 (×2): qty 2

## 2014-04-03 MED ORDER — ZOLPIDEM TARTRATE 5 MG PO TABS: 5.0000 mg | ORAL_TABLET | Freq: Every evening | ORAL | Status: DC | PRN

## 2014-04-03 MED ORDER — LACTATED RINGERS IV SOLN
INTRAVENOUS | Status: DC
  Administered 2014-04-03 – 2014-04-05 (×5): via INTRAVENOUS

## 2014-04-03 MED ORDER — ERYTHROMYCIN BASE 250 MG PO TABS
250.0000 mg | ORAL_TABLET | Freq: Four times a day (QID) | ORAL | Status: AC
  Administered 2014-04-05 – 2014-04-10 (×20): 250 mg via ORAL
  Filled 2014-04-03 (×20): qty 1

## 2014-04-03 MED ORDER — POLYETHYLENE GLYCOL 3350 17 G PO PACK
17.0000 g | PACK | Freq: Every day | ORAL | Status: DC | PRN
  Administered 2014-04-03 – 2014-04-05 (×3): 17 g via ORAL
  Filled 2014-04-03 (×3): qty 1

## 2014-04-03 MED ORDER — CALCIUM CARBONATE ANTACID 500 MG PO CHEW
2.0000 | CHEWABLE_TABLET | ORAL | Status: DC | PRN
  Administered 2014-04-10: 400 mg via ORAL
  Filled 2014-04-03 (×2): qty 1
  Filled 2014-04-03: qty 2

## 2014-04-03 MED ORDER — MAGNESIUM SULFATE BOLUS VIA INFUSION
4.0000 g | Freq: Once | INTRAVENOUS | Status: AC
  Administered 2014-04-03: 4 g via INTRAVENOUS
  Filled 2014-04-03: qty 500

## 2014-04-03 MED ORDER — AMOXICILLIN 500 MG PO CAPS
500.0000 mg | ORAL_CAPSULE | Freq: Three times a day (TID) | ORAL | Status: AC
Start: 2014-04-05 — End: 2014-04-09
  Administered 2014-04-05 – 2014-04-09 (×15): 500 mg via ORAL
  Filled 2014-04-03 (×15): qty 1

## 2014-04-03 MED ORDER — ACETAMINOPHEN 325 MG PO TABS: 650.0000 mg | ORAL_TABLET | ORAL | Status: DC | PRN

## 2014-04-03 MED ORDER — PRENATAL MULTIVITAMIN CH
1.0000 | ORAL_TABLET | Freq: Every day | ORAL | Status: DC
  Administered 2014-04-03 – 2014-04-16 (×14): 1 via ORAL
  Filled 2014-04-03 (×15): qty 1

## 2014-04-03 MED ORDER — SODIUM CHLORIDE 0.9 % IV SOLN
250.0000 mg | Freq: Four times a day (QID) | INTRAVENOUS | Status: AC
  Administered 2014-04-03 – 2014-04-05 (×8): 250 mg via INTRAVENOUS
  Filled 2014-04-03 (×8): qty 5

## 2014-04-03 MED ORDER — DOCUSATE SODIUM 100 MG PO CAPS
100.0000 mg | ORAL_CAPSULE | Freq: Every day | ORAL | Status: DC
  Administered 2014-04-03: 100 mg via ORAL
  Filled 2014-04-03 (×2): qty 1

## 2014-04-03 MED ORDER — SODIUM CHLORIDE 0.9 % IV SOLN
2.0000 g | Freq: Four times a day (QID) | INTRAVENOUS | Status: AC
  Administered 2014-04-03 – 2014-04-05 (×7): 2 g via INTRAVENOUS
  Filled 2014-04-03 (×7): qty 2000

## 2014-04-03 MED ORDER — DOCUSATE SODIUM 100 MG PO CAPS
100.0000 mg | ORAL_CAPSULE | Freq: Two times a day (BID) | ORAL | Status: DC
  Administered 2014-04-03 – 2014-04-12 (×18): 100 mg via ORAL
  Filled 2014-04-03 (×19): qty 1

## 2014-04-03 MED ORDER — SODIUM CHLORIDE 0.9 % IV SOLN
2.0000 g | Freq: Once | INTRAVENOUS | Status: AC
  Administered 2014-04-03: 2 g via INTRAVENOUS
  Filled 2014-04-03: qty 2000

## 2014-04-03 MED ORDER — MAGNESIUM SULFATE 40 G IN LACTATED RINGERS - SIMPLE
2.0000 g/h | INTRAVENOUS | Status: DC
  Administered 2014-04-04 – 2014-04-05 (×2): 2 g/h via INTRAVENOUS
  Filled 2014-04-03 (×3): qty 500

## 2014-04-03 NOTE — MAU Provider Note (Signed)
History     CSN: [id number]  Arrival date and time: 04/03/14 0218   First Provider Initiated Contact with Patient 04/03/14 0240      Chief Complaint  Patient presents with  . Vaginal Bleeding   Vaginal Bleeding Pertinent negatives include no abdominal pain, chills, constipation, diarrhea, fever, nausea or vomiting.   This is a 38 y.o. female at 6217w6d who presents with c/o feeling wet during the night and seeing blood and clots in bathroom. Denies pain. + FM. Has been on pelvic rest  History is remarkable for previous C/S in 6/14.  She was noted to have a placenta previa on 02/21/14 with report of anterior and posterior lobes noted to have a marginally inserted cord on the "thin rim" of placenta. US on 03/22/14 noted previa to be resolved with edge 2cm away from os.   OB History   Grav Para Term Preterm Abortions TAB SAB Ect Mult Living   4 1 1  2     1       Past Medical History  Diagnosis Date  . PONV (postoperative nausea and vomiting)   . Headache(784.0)   . Anemia   . Hypertension   . HTN in pregnancy, chronic 01/29/2013    Past Surgical History  Procedure Laterality Date  . Dilation and curettage of uterus      x 2 EAB  . Wisdom tooth extraction    . Cesarean section N/A 02/09/2013    Procedure: Primary CESAREAN SECTION of baby girl at 0149 APGAR 7/9;  Surgeon: Purcell NailsAngela Y Roberts, MD;  Location: [facility];  Service: Obstetrics;  Laterality: N/A;    Family History  Problem Relation Age of Onset  . Depression Mother   . Hypertension Mother   . Cancer Father [id number]    prostate  . Hypertension Brother   . Cancer Paternal Uncle     prostate  . Depression Maternal Grandmother   . Heart disease Maternal Grandmother     CHF  . Hypertension Maternal Grandmother   . Stroke Maternal Grandmother   . Cancer Maternal Grandfather     brain  . Cancer Paternal Grandfather     colon  . Hypertension Brother     History  Substance Use Topics  . Smoking status: Never Smoker   .  Smokeless tobacco: Never Used  . Alcohol Use: No     Comment: none since + UPT    Allergies: No Known Allergies  Prescriptions prior to admission  Medication Sig Dispense Refill  . calcium carbonate (TUMS - DOSED IN MG ELEMENTAL CALCIUM) 500 MG chewable tablet Chew 2 tablets by mouth daily as needed for indigestion or heartburn.      . Prenatal Vit-Min-FA-Fish Oil ([employer] PRENATAL GUMMY PO) Take 2 tablets by mouth daily.        Review of Systems  Constitutional: Negative for fever, chills and malaise/fatigue.  Gastrointestinal: Negative for nausea, vomiting, abdominal pain, diarrhea and constipation.  Genitourinary: Positive for vaginal bleeding.       Vaginal bleeding    Physical Exam   Blood pressure 117/82, pulse 82, temperature 98.3 F (36.8 C), resp. rate 18, unknown if currently breastfeeding.  Physical Exam  Constitutional: She is oriented to person, place, and time. She appears well-developed and well-nourished. No distress.  Cardiovascular: Normal rate.   Respiratory: Effort normal.  GI: Soft. She exhibits no distension and no mass. There is no tenderness. There is no rebound and no guarding.  Genitourinary: Uterus normal. Vaginal discharge (  Watery, wine colored fluid coming from os, cervix difficult to see, gentle vaginal exam with cervix possibley open and ? 50% effaced, very soft) found.  Musculoskeletal: Normal range of motion.  Neurological: She is alert and oriented to person, place, and time.  Skin: Skin is warm and dry.  Psychiatric: She has a normal mood and affect.   FHR reassuring, no decels Occasional uterine irritability MAU Course  Procedures  MDM DIscussed with Dr Langston Masker.  [place] ordered and CBC/hold clot ordered.  [place] completed. Informed by Perinatologist of Marginal Previa, Tenting of membranes and need for immediate admission and continuous EFM.  Pt and Dr Langston Masker informed.     Assessment and Plan  A:  SIUP at [redacted]w[redacted]d        Probable PPROM        Marginal Placenta Previa       Succenturate placental lobe with communicating membrane and vessel      P:  Admit      Continuous EFM       He recommends Treatment for PPROM       Dr Langston Masker to follow [facility] 04/03/2014, 2:59 AM

## 2014-04-03 NOTE — MAU Note (Signed)
Awoke this am and thougth i had peed on myself. [facility] and was [facility]. Have passed 3 golf ball size clots. Hx previa but was here 1.5wks ago and went to u/s and told did not see previa

## 2014-04-03 NOTE — Consult Note (Signed)
Neonatology Consult to Antenatal Patient:  I was asked by Dr. Langston MaskerMorris to see this patient in order to provide antenatal counseling due to vaginal bleeding at 26 6/7 weeks.  Ms. Angela Graham was admitted this morning at 26 6/[redacted] weeks GA due to heavy vaginal bleeding. There has been a low-lying placenta with a succenturiate lobe at the fundus. She has premature ROM with normal AFI. She is currently not having active labor. She is getting BMZ, Magnesium sulfate for NP, and IV Ampicillin and Erythromycin. Plans are for the patient to remain hospitalized until delivery at 34 weeks, if not required sooner.  I spoke with the patient and her husband. We discussed the worst case of delivery in the next 1-2 days, including usual DR management, possible respiratory complications and need for support, IV access, feedings (mother desires breast feeding, which was encouraged), LOS, Mortality and Morbidity, and long term outcomes. She and her husband did not have any questions at this time. I offered a NICU tour to any interested family members and would be glad to come back if they have more questions later.  Thank you for asking me to see this patient.  Doretha Souhristie C. Hanny Elsberry, MD Neonatologist  The total length of face-to-face or floor/unit time for this encounter was 30 minutes. Counseling and/or coordination of care was 20 minutes of the above.

## 2014-04-03 NOTE — H&P (Signed)
Angela Graham is a 38 y.o. female presenting for recurrent vaginal bleeding last night.  She has been followed for the same in the office but the episode last night was heavier than usual with passage of several large clots; painless.  The patient presented to MAU where speculum exam was suspicious for ROM.  Ultrasound exam confirmed PPROM with fundal tenting of membranes but normal AFI.  The patient's pregnancy has been complicated by succenturiate lobe which was noted at the fundus with placenta low-lying (1.8-1.9 cm from the os).  There is a vulnerable vessel connecting the lobe to the placenta near the fundus. The patient has history of previous CD with plans for repeat this pregnancy.  History of hypertension in previous pregnancy but not in current pregnancy.  Currently (and while in house), the patient has had light, dark bleeding and minimal leakage.  She denies CTX and reports good FM.  Shortly after admission, CTX were noted on the monitor but the patient did not feel them.  Maternal Medical History:  Reason for admission: Vaginal bleeding.   Fetal activity: Perceived fetal activity is normal.   Last perceived fetal movement was within the past hour.    Prenatal complications: Bleeding and placental abnormality.   Prenatal Complications - Diabetes: none.    OB History   Grav Para Term Preterm Abortions TAB SAB Ect Mult Living   4 1 1  2     1      Past Medical History  Diagnosis Date  . PONV (postoperative nausea and vomiting)   . Headache(784.0)   . Anemia   . Hypertension   . HTN in pregnancy, chronic 01/29/2013   Past Surgical History  Procedure Laterality Date  . Dilation and curettage of uterus      x 2 EAB  . Wisdom tooth extraction    . Cesarean section N/A 02/09/2013    Procedure: Primary CESAREAN SECTION of baby girl at 0149 APGAR 7/9;  Surgeon: Purcell Nails, MD;  Location: [facility];  Service: Obstetrics;  Laterality: N/A;   Family History: family history  includes Cancer in her maternal grandfather, paternal grandfather, and paternal uncle; Cancer (age of onset: 66) in her father; Depression in her maternal grandmother and mother; Heart disease in her maternal grandmother; Hypertension in her brother, brother, maternal grandmother, and mother; Stroke in her maternal grandmother. Social History:  reports that she has never smoked. She has never used smokeless tobacco. She reports that she does not drink alcohol or use illicit drugs.   Prenatal Transfer Tool  Maternal Diabetes: No Genetic Screening: Normal Maternal Ultrasounds/Referrals: Abnormal:  Findings:   Other: succenturiate lobe Fetal Ultrasounds or other Referrals:  Referred to Materal Fetal Medicine  Maternal Substance Abuse:  No Significant Maternal Medications:  None Significant Maternal Lab Results:  None Other Comments:  None  ROS  Dilation: Fingertip Effacement (%): 50 Exam by:: M.Williams,CNM Blood pressure 138/61, pulse 109, temperature 98.3 F (36.8 C), temperature source Oral, resp. rate 16, unknown if currently breastfeeding. Maternal Exam:  Abdomen: Patient reports no abdominal tenderness. Surgical scars: low transverse.   Fundal height is c/w dates.   Fetal presentation: vertex     Physical Exam  Constitutional: She is oriented to person, place, and time. She appears well-developed and well-nourished.  GI: Soft. There is no rebound and no guarding.  Neurological: She is alert and oriented to person, place, and time.  Skin: Skin is warm and dry.  Psychiatric: She has a normal mood and  affect. Her behavior is normal.    Prenatal labs: ABO, Rh:   Antibody:   Rubella:   RPR:    HBsAg:    HIV:    GBS:     Assessment/Plan: 37yo G4P1021 at 7167w6d with PPROM, likely marginal abruption -PPROM  -latency antibiotics   -BMZ   -magnesium sulfate for NP and tocolysis through BMZ maturity   -NICU consult -Marginal abruption  -Rpt CD for fetal distress,  infection, recurrence of bleeding; patient was counseled  -Hemoglobin stable at 10.  Will recheck in AM  -CEFM  Angela Graham 04/03/2014, 8:34 AM

## 2014-04-04 LAB — CBC
HEMATOCRIT: 29.7 % — AB (ref 36.0–46.0)
Hemoglobin: 10 g/dL — ABNORMAL LOW (ref 12.0–15.0)
MCH: 30.6 pg (ref 26.0–34.0)
MCHC: 33.7 g/dL (ref 30.0–36.0)
MCV: 90.8 fL (ref 78.0–100.0)
Platelets: 211 10*3/uL (ref 150–400)
RBC: 3.27 MIL/uL — ABNORMAL LOW (ref 3.87–5.11)
RDW: 12.9 % (ref 11.5–15.5)
WBC: 15 10*3/uL — ABNORMAL HIGH (ref 4.0–10.5)

## 2014-04-04 NOTE — Progress Notes (Signed)
No complaints.  No vaginal bleeding or leaking.  +FM.  VSS. AM labs pending  Gen: A&O x 3 Abd: soft, NT Ext: no c/c/e  37yo G4P1021 at 7123w0d with marginal abruption and PPROM -Cont latency abx -BMZ -Magnesium sulfate through BMZ maturity (tomorrow AM) -F/U labs  Mitchel HonourMegan Escher Harr, DO

## 2014-04-05 NOTE — Progress Notes (Signed)
Patient ID: Charlton AmorJerilyn Elizabeth Kochan, female   DOB: 1975/09/01, 38 y.o.   MRN: [id number] 7315w1d  S// no vag bleeding or leaking O//BP 120/67  Pulse 83  Temp(Src) 98 F (36.7 C) (Oral)  Resp 18 Results for orders placed during the hospital encounter of 04/03/14 (from the past 24 hour(s))  CBC     Status: Abnormal   Collection Time    04/04/14  9:00 AM      Result Value Ref Range   WBC 15.0 (*) 4.0 - 10.5 K/uL   RBC 3.27 (*) 3.87 - 5.11 MIL/uL   Hemoglobin 10.0 (*) 12.0 - 15.0 g/dL   HCT 82.929.7 (*) 56.236.0 - 13.046.0 %   MCV 90.8  78.0 - 100.0 fL   MCH 30.6  26.0 - 34.0 pg   MCHC 33.7  30.0 - 36.0 g/dL   RDW 86.512.9  78.411.5 - 69.615.5 %   Platelets 211  150 - 400 K/uL    FHR 142  A+P// 8315w1d  IUP, PPROM + low lying placenta, S/P BMZ, will d/c MG SO4 and obsv

## 2014-04-05 NOTE — Progress Notes (Signed)
Patient called for nurse at 1918, stating, "I'm bleeding."  Pt in bed, stated was on toilet, had urge to have bowel movement.  Stated she did not strain, yet noted bright red blood on tissue when wiped. Pt did not have bowel movement, & did not flush so RN could see blood.  Noted dime-sized blood clot on tissue in toilet. No blood noted in water. Also noted dime-sized clot with 3cm long streak of bright red blood across top of new peri pad. Pad was not saturated. Changed pad.  Monitors reconnected upon return from bathroom to bed. Pt denies any cramping or feeling contractions. FHR reassuring on monitor. Called and informed Dr. Marcelle OverlieHolland of above. MD stated to continue to watch.

## 2014-04-06 ENCOUNTER — Inpatient Hospital Stay (HOSPITAL_COMMUNITY): Payer: BC Managed Care – PPO

## 2014-04-06 MED ORDER — SODIUM CHLORIDE 0.9 % IJ SOLN
3.0000 mL | Freq: Two times a day (BID) | INTRAMUSCULAR | Status: DC
  Administered 2014-04-06 – 2014-04-14 (×17): 3 mL via INTRAVENOUS

## 2014-04-06 MED ORDER — POLYETHYLENE GLYCOL 3350 17 G PO PACK
17.0000 g | PACK | Freq: Two times a day (BID) | ORAL | Status: DC
  Administered 2014-04-06 – 2014-04-12 (×13): 17 g via ORAL
  Filled 2014-04-06 (×15): qty 1

## 2014-04-06 NOTE — Consult Note (Signed)
Maternal Fetal Medicine Consultation  Requesting Provider(s): Candice Campavid Lowe, MD  Reason for consultation: PROM, low lying placenta, vaginal bleeding  HPI: Angela Graham is a 38 yo G4P1-0-2-1 currently at 27w 2d who is currently admitted due to PROM and vaginal bleeding.  The patient reports a history of placenta previa this pregnancy.  She has been seen in MAU on a number of occasions for vaginal bleeding.  Over the weekend, the patient was seen in the MAU due to vaginal bleeding and leakage of fluid.  She reports passing 2-3 golf ball sized clots at that time. She was diagnosed with PROM by sterile speculum exam and started on latency antibiotics.  Limited ultrasound performed in MAU showed a posterior low lying placenta (1.8 cm from the internal os) on transvaginal ultrasound.  Additionally, a succenturiate love of the placenta was noted with a bridging feeder vessel coursing along the uterine fundus between the two lobes of the placenta.  No retroplacental fluid collections were noted, although given the patient's history, there was a strong concern for placental abruption.  Since admission, her vaginal bleeding has improved, but did experience some vaginal bleeding last night.  She is currently without complaints and reports that she is no longer leaking fluid.  The patient's past OB history is remarkable for a prior term C-section for non-reassuring fetal testing and arrest of descent.  She plans to have a repeat C-section this pregnancy.  The patient also reports a history of chronic hypertension but has never required medications.  She reports that her blood pressures were became higher toward the end of her last pregnancy, but that she was never diagnosed with preeclampsia.  OB History: OB History   Grav Para Term Preterm Abortions TAB SAB Ect Mult Living   4 1 1  2     1       PMH:  Past Medical History  Diagnosis Date  . PONV (postoperative nausea and vomiting)   .  Headache(784.0)   . Anemia   . Hypertension   . HTN in pregnancy, chronic 01/29/2013    PSH:  Past Surgical History  Procedure Laterality Date  . Dilation and curettage of uterus      x 2 EAB  . Wisdom tooth extraction    . Cesarean section N/A 02/09/2013    Procedure: Primary CESAREAN SECTION of baby girl at 0149 APGAR 7/9;  Surgeon: Purcell NailsAngela Y Roberts, MD;  Location: [facility];  Service: Obstetrics;  Laterality: N/A;   Meds:  Scheduled Meds: . amoxicillin  500 mg Oral Q8H  . docusate sodium  100 mg Oral BID  . erythromycin  250 mg Oral 4 times per day  . polyethylene glycol  17 g Oral BID  . prenatal multivitamin  1 tablet Oral Q1200   Continuous Infusions: . lactated ringers Stopped (04/05/14 0900)   PRN Meds:.acetaminophen, calcium carbonate, zolpidem  Allergies: No Known Allergies  FH:  Family History  Problem Relation Age of Onset  . Depression Mother   . Hypertension Mother   . Cancer Father 1359    prostate  . Hypertension Brother   . Cancer Paternal Uncle     prostate  . Depression Maternal Grandmother   . Heart disease Maternal Grandmother     CHF  . Hypertension Maternal Grandmother   . Stroke Maternal Grandmother   . Cancer Maternal Grandfather     brain  . Cancer Paternal Grandfather     colon  . Hypertension Brother    Soc:  History   Social History  . Marital Status: Married    Spouse Name: Zarianna Dicarlo    Number of Children: 0  . Years of Education: 16   Occupational History  . speech therapist    Social History Main Topics  . Smoking status: Never Smoker   . Smokeless tobacco: Never Used  . Alcohol Use: No     Comment: none since + UPT  . Drug Use: No  . Sexual Activity: Yes    Birth Control/ Protection: None   Other Topics Concern  . Not on file   Social History Narrative   ** Merged History Encounter **        Review of Systems: no vaginal bleeding or cramping/contractions, no LOF, no nausea/vomiting. All other systems reviewed  and are negative.  PE:   Filed Vitals:   04/06/14 0815  BP: 109/67  Pulse: 77  Temp: 98.4 F (36.9 C)  Resp: 16    GEN: well-appearing female ABD: gravid, NT  Please see separate document for fetal ultrasound report.  Labs: CBC    Component Value Date/Time   WBC 15.0* 04/04/2014 0900   RBC 3.27* 04/04/2014 0900   HGB 10.0* 04/04/2014 0900   HCT 29.7* 04/04/2014 0900   PLT 211 04/04/2014 0900   MCV 90.8 04/04/2014 0900   MCH 30.6 04/04/2014 0900   MCHC 33.7 04/04/2014 0900   RDW 12.9 04/04/2014 0900   LYMPHSABS 2.2 07/07/2012 1548   MONOABS 0.5 07/07/2012 1548   EOSABS 0.1 07/07/2012 1548   BASOSABS 0.0 07/07/2012 1548    A/P: 1) Single IUP at 27w 2d          2) PROM/ vaginal bleeding - concur with latency antibiotics and betamethasone.  We had a brief discussion regarding PROM and management.  The patient is currently on continuous fetal monitoring - given that she has been stable since admission, may discontinue continuous monitoring - may be more reasonable to have BID to TID fetal strips and continuous monitoring if there is any significant clinical change.  Would consider re-dosing Magnesium sulfate for neuro protection (no more than 12 hours of therapy) in the event that delivery appears to be imminent.  The patient currently does not have any signs or symptoms of chorioamnionitis         3) Low lying placenta - difficult to determine if the bleeding is due to low lying placenta vs. Abruption as the patient is at higher risk for abruption due to PROM.  Would base decision to move toward delivery on clinical scenario (i.e. Fetal tracing, amount of blood loss, etc.)         4) Succenturiate lobe - would not anticipate any significant clinical issues based on the location of the bridging vessel - risk of "rupturing" this vessel is very low.  Recommendations: 1) Continued expectant management 2) Complete latency antibiotics (full 7 day course) 3) BID to TID fetal strips 4) If the patient  has not had a recent growth scan, please order.  Will need growth scans every 3-4 weeks while hospitalized 5) Delivery based on clinical scenario  (suspicion for chorioamnionitis, labor, non reassuring fetal testing, significant vaginal bleeding) - if stable, would recommend delivery at 34 weeks. 6) If the patient remains stable, would consider a second course of betamethasone at approximately 30 weeks   Thank you for the opportunity to be a part of the care of Angela Graham. Please contact our office if we can be of further  assistance.   I spent approximately 30 minutes with this patient with over 50% of time spent in face-to-face counseling.  Alpha GulaPaul Maryori Weide, MD Maternal Fetal Medicine

## 2014-04-06 NOTE — Progress Notes (Signed)
Patient ID: Charlton AmorJerilyn Elizabeth Graham, female   DOB: 25-Apr-1976, 38 y.o.   MRN: [id number] Pt without complaints GFM  No leaking of fluid Vag spotting last night when attempting BM No BM x 4 days  VSSAF FHR cat 1  No decels or ctxs 140s  Abd Gravid, nt Neg homans  IUP at 27 2/7 PPROM - no leaking at this time Marginal Previa/?Marginal Abruption - bleeding stable.  Continue to monitor Placenta 1.8 cm away from cx.  No US evidence of abruption  (there is pos wall suc placenta with vessel to main placenta at fundus ) Prev C/S for repeat S/P BMZ, Mag, IV Abx currently on latency Abx US thursday

## 2014-04-07 ENCOUNTER — Encounter (HOSPITAL_COMMUNITY): Payer: BC Managed Care – PPO | Admitting: Anesthesiology

## 2014-04-07 ENCOUNTER — Inpatient Hospital Stay (HOSPITAL_COMMUNITY): Payer: BC Managed Care – PPO

## 2014-04-07 ENCOUNTER — Inpatient Hospital Stay (HOSPITAL_COMMUNITY): Payer: BC Managed Care – PPO | Admitting: Anesthesiology

## 2014-04-07 NOTE — Progress Notes (Signed)
Patient ID: Angela AmorJerilyn Elizabeth Graham, female   DOB: Oct 02, 1975, 38 y.o.   MRN: [id number] S: MINIMAL LEAKAGE GOOD FM A: AF VSS     GRAVID UTERUS NONTENDER     FHR LAST PM CAT ONE O: IUP AT 27.3 WITH PROM AND LOW LYING PLACENTA A: EXP MANAGEMENT SONO FOR GROWTH

## 2014-04-07 NOTE — Anesthesia Preprocedure Evaluation (Deleted)
Anesthesia Evaluation  Patient identified by MRN, date of birth, ID band Patient awake    Reviewed: Allergy & Precautions, H&P , NPO status , Patient's Chart, lab work & pertinent test results  History of Anesthesia Complications (+) PONV and history of anesthetic complications  Airway Mallampati: II      Dental   Pulmonary  breath sounds clear to auscultation        Cardiovascular Exercise Tolerance: Good hypertension, Rhythm:regular Rate:Normal     Neuro/Psych  Headaches,    GI/Hepatic   Endo/Other    Renal/GU      Musculoskeletal   Abdominal   Peds  Hematology  (+) anemia ,   Anesthesia Other Findings   Reproductive/Obstetrics (+) Pregnancy                           Anesthesia Physical Anesthesia Plan  ASA: III and emergent  Anesthesia Plan: Spinal   Post-op Pain Management:    Induction:   Airway Management Planned:   Additional Equipment:   Intra-op Plan:   Post-operative Plan:   Informed Consent: I have reviewed the patients History and Physical, chart, labs and discussed the procedure including the risks, benefits and alternatives for the proposed anesthesia with the patient or authorized representative who has indicated his/her understanding and acceptance.     Plan Discussed with: Anesthesiologist, CRNA and Surgeon  Anesthesia Plan Comments:         Anesthesia Quick Evaluation

## 2014-04-08 LAB — TYPE AND SCREEN
ABO/RH(D): A POS
ANTIBODY SCREEN: NEGATIVE

## 2014-04-08 NOTE — Progress Notes (Addendum)
Pt denies VB and LOF.  + FM  + FHT AF, VSS Gen - NAD Abd - gravid, NT Ext NT  US 8/12:  Transverse 1227gms, low lying placenta 1.5cm, AFI - 6.6cm pocket  A/P:  VB in pregnancy with suspected PPROM S/p BMZ (8/8, 8/9) Latency Abx Inpatient rest Repeat c-section for delivery

## 2014-04-09 NOTE — Progress Notes (Signed)
27 5/7 wks Small spot of dark blood with BM last pm, none since, no cramping  VSS Afeb Uterus soft/NT FHT + accels, no UCs  A/P: PPROM        Placenta Previa/Low Lying Placenta with bleeding-possible marginal abruption        Previous C/S-now C/S for delivery        D/W patient C/S for delivery and risks - infection, organ damage, bleeding/transfusion-HIV/Hep, DVT/PE, pneumonia. Patient requests BTL-carefully discussed permanence and alternative contraception as well as possible delivery with preterm infant. She states she understands and wants BTL. reviewed permanence, failure rate, increased ectopic risk.

## 2014-04-09 NOTE — Progress Notes (Signed)
Pt back in bed after shower; bed changed

## 2014-04-09 NOTE — Progress Notes (Signed)
Antenatal Nutrition Assessment:  Currently  27 5/[redacted] weeks gestation, with placenta previa/bleeding. Height  66 "  Weight 190 lbs  pre-pregnancy weight 179 lbs .  Pre-pregnancy  BMI 28.9  IBW 130 lbs Total weight gain 11.lbs Weight gain goals 15-25 lbs Estimated needs: 1900-2100 kcal/day, 80-90 grams protein/day, 2.3 liters fluid/day  Regular diet  Current diet prescription will provide for increased needs.  No abnormal nutrition related labs.  Hemoglobin & Hematocrit     Component Value Date/Time   HGB 10.0* 04/04/2014 0900   HCT 29.7* 04/04/2014 0900     Nutrition Dx: Increased nutrient needs r/t pregnancy and fetal growth requirements aeb [redacted] weeks gestation.  No educational needs assessed at this time.  Elisabeth CaraKatherine Travaughn Vue M.Odis LusterEd. R.D. LDN Neonatal Nutrition Support Specialist/RD III Pager [phone]

## 2014-04-10 MED ORDER — FAMOTIDINE 20 MG PO TABS
20.0000 mg | ORAL_TABLET | Freq: Two times a day (BID) | ORAL | Status: DC
  Administered 2014-04-10 – 2014-04-16 (×11): 20 mg via ORAL
  Filled 2014-04-10 (×12): qty 1

## 2014-04-10 NOTE — Progress Notes (Signed)
Small spot of light red blood on tissue last pm with BM No pain, no cramps, no leaking  VSS Afeb FHT + accels , no UCs last pm  A/P: PPROM         VB-Low Lying Placenta, poss marginal abruption         Stable , continue present care

## 2014-04-11 LAB — TYPE AND SCREEN
ABO/RH(D): A POS
Antibody Screen: NEGATIVE

## 2014-04-11 NOTE — Progress Notes (Signed)
28 0/7 wks  No bleeding, no C/O  VSS Afeb Ut soft and NT  FHT + accels, rare UC  A/P: VB-Low lying placenta, poss marginal abruption         Succenturiate lobe with bridging vessel         PPROM by history and fundal tenting of membranes on U/S           S/P BMZ and latency atb ordered           Serial U/S for growth q 3 weeks           MgSO4 if labors for neuro protection           Repeat BMZ about 30 weeks           Repeat C/S for delivery

## 2014-04-12 NOTE — Progress Notes (Signed)
S:  Patient is resting. Feels good. Baby moving well. Spotting only when she has a bowel movement.  VS: Afebrile VSS FHTones have been normal  Abdomen is soft and non tender  IMPRESSION: IUP at 28 w 1 day PPROM Vaginal bleeding - marginal abruption with succenturiate lobe with bridging vessel Previous C Section  PLAN: Status post betamethasone Status post antibiotic Ultrasound every 3 weeks Magnesium for neuroprotection if delivery is imminent Repeat Steroids at 30 weeks per Dr. Claudean SeveranceWhitecar recommendations C Section for delivery - patient discussed with Dr. Henderson Cloudomblin she also wants a BTL

## 2014-04-12 NOTE — Progress Notes (Signed)
Ur chart review completed.  

## 2014-04-13 MED ORDER — FLUCONAZOLE 150 MG PO TABS
150.0000 mg | ORAL_TABLET | Freq: Once | ORAL | Status: AC
  Administered 2014-04-13: 150 mg via ORAL
  Filled 2014-04-13: qty 1

## 2014-04-13 MED ORDER — POLYETHYLENE GLYCOL 3350 17 G PO PACK
17.0000 g | PACK | Freq: Every day | ORAL | Status: DC | PRN
  Filled 2014-04-13: qty 1

## 2014-04-13 MED ORDER — DOCUSATE SODIUM 100 MG PO CAPS
100.0000 mg | ORAL_CAPSULE | Freq: Three times a day (TID) | ORAL | Status: DC
  Administered 2014-04-13 – 2014-04-16 (×11): 100 mg via ORAL
  Filled 2014-04-13 (×11): qty 1

## 2014-04-13 NOTE — Progress Notes (Signed)
Patient ID: Charlton AmorJerilyn Elizabeth Graham, female   DOB: Mar 14, 1976, 38 y.o.   MRN: [id number] Pt without complaints GFM  No leaking occas spotting with BMs  VSSAF FHR Cat 1 Ctxs none  Abd Gravid, nt Neg Homans  IUP at 28 2/7  Vag bleeding/Marginal abruption/low lying placenta - stable, cont current management ?PPROM - based on original US only - if any leaking, check amnisure S/P BMZ, Abx Mag for neuroprotection if labors C/S for delivery with BTL BPP wkly - next on Thursday Growth US in 3 weeks

## 2014-04-14 LAB — TYPE AND SCREEN
ABO/RH(D): A POS
Antibody Screen: NEGATIVE

## 2014-04-14 NOTE — Progress Notes (Signed)
6926w3d  S// no leaking or bleeding, good FM  O//BP 112/74  Pulse 89  Temp(Src) 98.3 F (36.8 C) (Oral)  Resp 20  Ht 5\' 6"  (1.676 m)  Wt 190 lb 7 oz (86.382 kg)  BMI 30.75 kg/m2  SpO2 1%  FHR cat 1  A+P//7426w3d, ?PPROM w/ low lying placenta>>cont obsv

## 2014-04-15 ENCOUNTER — Inpatient Hospital Stay (HOSPITAL_COMMUNITY): Payer: BC Managed Care – PPO

## 2014-04-15 NOTE — Progress Notes (Signed)
Ur chart review completed.  

## 2014-04-15 NOTE — Progress Notes (Signed)
No c/o.  No VB since light spotting with BM 2 days ago.  No LOF.  +FM.  No abdominal tenderness, f/c.  VSS.  AF. Gen: A&O x 3 Abd: soft, NT Ext: no c/c/e FHT Cat I  37yo G4P1 at [redacted]w[redacted]d with low-lying placenta and ? Marginal abruption and ? PPROM -PPROM-s/p BMZ and latency abx.  Normal AFI -?marginal abruption-No significant bleeding since admission -S/P MFM ultrasound this AM-report pending  Mitchel HonourMegan Tecumseh Yeagley, DO

## 2014-04-15 NOTE — Progress Notes (Signed)
To MFM per wheelchair.

## 2014-04-16 MED ORDER — DSS 100 MG PO CAPS: 100.0000 mg | ORAL_CAPSULE | Freq: Every day | ORAL | Status: DC

## 2014-04-16 NOTE — Discharge Summary (Signed)
Physician Discharge Summary  Patient ID: Angela AmorJerilyn Elizabeth Graham MRN: [id number] DOB/AGE: [birth year] 37 y.o.  Admit date: 04/03/2014 Discharge date: 04/16/2014  Admission Diagnoses:  Vaginal Bleeding  Discharge Diagnoses:  Active Problems:   Antepartum bleeding   Discharged Condition: stable  Hospital Course: Pt was admitted after episode of vaginal bleeding and possible leaking fluid.  Pt received latency antibiotics, magnesium, and BMZ.  Pt remained stable on bedrest in the antenatal unit.  Last US by MFM shows good growth & AFV.  MFM rec outpatient mngt.  Consults: MFM  Significant Diagnostic Studies: ultrasound  Treatments: antibiotics: erythromycin, azithromycin  Discharge Exam: Blood pressure 120/65, pulse 108, temperature 97.4 F (36.3 C), temperature source Oral, resp. rate 18, height 5\' 6"  (1.676 m), weight 86.047 kg (189 lb 11.2 oz), SpO2 1.00%, unknown if currently breastfeeding. General appearance: alert, cooperative and appears stated age Abd - gravid, NT  Disposition: 01-Home or Self Care     Medication List         calcium carbonate 500 MG chewable tablet  Commonly known as:  TUMS - dosed in mg elemental calcium  Chew 2 tablets by mouth daily as needed for indigestion or heartburn.     [employer] PRENATAL GUMMY PO  Take 2 tablets by mouth daily.     DSS 100 MG Caps  Take 100 mg by mouth daily.           Follow-up Information   Schedule an appointment as soon as possible for a visit in 3 days to follow up.      Signed: Jovian Lembcke 04/16/2014, 3:03 PM

## 2014-04-16 NOTE — Progress Notes (Signed)
No c/o. No VB since light spotting with BM 2 days ago. No LOF. +FM. No abdominal tenderness, f/c.   VSS. AF.  Gen: NAD Abd: gravid, NT  Ext: no c/c/e  FHT Cat I   37yo G4P1 at 741w5d with low-lying placenta and ? Marginal abruption and ? PPROM   -PPROM-s/p BMZ and latency abx. Normal AFI.  Appears diagnosis make my tenting membranes but this may be synechia -?marginal abruption/low lying placenta-No significant bleeding since admission  -S/P MFM ultrasound yesterday - awaiting recs from dr decker.  Pt interested in outpt managment

## 2014-04-16 NOTE — Progress Notes (Signed)
Discussed plan of care with Dr Sherrie Georgeecker today MFM feels pt stable for outpatient management Pt desires discharge, has good support at home for help with her toddler Will plan for d/c and f/u in office Monday with NST and OB check

## 2014-04-16 NOTE — Progress Notes (Signed)
Pt. Is stable and ready for discharge. All discharge instructions given and prescriptions reviewed. All questions answered. Pt. Has all belongings. Pt. Is wheeled out via wheelchair to husband's car.

## 2014-04-19 NOTE — Progress Notes (Signed)
Post discharge ur review completed. 

## 2014-06-21 ENCOUNTER — Encounter (HOSPITAL_COMMUNITY): Payer: Self-pay | Admitting: Pharmacist

## 2014-06-25 ENCOUNTER — Encounter (HOSPITAL_COMMUNITY): Payer: Self-pay

## 2014-06-25 NOTE — Patient Instructions (Addendum)
   Your procedure is scheduled on:  Tuesday, Nov 3  [facility]:  11:30 AM Pick up the phone at the desk and dial [phone] and inform us of your arrival.  Please call this number if you have any problems the morning of surgery: [phone]  Remember: Do not eat food after midnight: Monday Do not drink clear liquids after: 9 AM Tuesday, day of surgery Take these medicines the morning of surgery with a SIP OF WATER:  None  Do not wear jewelry, make-up, or FINGER nail polish No metal in your hair or on your body. Do not wear lotions, powders, perfumes.  You may wear deodorant.  Do not bring valuables to the hospital. Contacts, dentures or bridgework may not be worn into surgery.  Leave suitcase in the car. After Surgery it may be brought to your room. For patients being admitted to the hospital, checkout time is 11:00am the day of discharge.  Home with Lisbeth PlyJulius cell [phone]

## 2014-06-25 NOTE — H&P (Addendum)
Angela Graham Angela Graham is a 38 y.o. female presenting for Repeat C/S and BTL.  Pregnancy complicated by AMA with normal Panorama NIPT.  Also complicated by Marginal abruption with hospitalization including magnesium and BMZ series.  Chronic HTN currently stable.  Pt desires repeat C/S and sterilzation. History OB History   Grav Para Term Preterm Abortions TAB SAB Ect Mult Living   4 1 1  2     1      Past Medical History  Diagnosis Date  . PONV (postoperative nausea and vomiting)   . Headache(784.0)   . Anemia   . Hypertension   . HTN in pregnancy, chronic 01/29/2013  . Termination of pregnancy (fetus) 1996, 2003    x 2   Past Surgical History  Procedure Laterality Date  . Dilation and curettage of uterus      x 2 EAB  . Wisdom tooth extraction    . Cesarean section N/A 02/09/2013    Procedure: Primary CESAREAN SECTION of baby girl at 0149 APGAR 7/9;  Surgeon: Purcell NailsAngela Y Roberts, MD;  Location: [facility];  Service: Obstetrics;  Laterality: N/A;   Family History: family history includes Cancer in her maternal grandfather, paternal grandfather, and paternal uncle; Cancer (age of onset: 5659) in her father; Depression in her maternal grandmother and mother; Heart disease in her maternal grandmother; Hypertension in her brother, brother, maternal grandmother, and mother; Stroke in her maternal grandmother. Social History:  reports that she has never smoked. She has never used smokeless tobacco. She reports that she does not drink alcohol or use illicit drugs.   Prenatal Transfer Tool  Maternal Diabetes: no Genetic Screening: Normal Maternal Ultrasounds/Referrals: Normal Fetal Ultrasounds or other Referrals:  [facility] as above.  Normal fetus. Maternal Substance Abuse:  No Significant Maternal Medications:  None Significant Maternal Lab Results:  None Other Comments:  None  ROS    unknown if currently breastfeeding. Exam Physical Exam  Prenatal labs: ABO, Rh: --/--/A  POS (08/19 0810) Antibody: NEG (08/19 0810) Rubella: Immune (05/05 0000) RPR: Nonreactive (05/05 0000)  HBsAg: Negative (05/05 0000)  HIV: Non-reactive (05/05 0000)  GBS:     Assessment/Plan: IUP Term with above complications. Plan C/S and BTL Risks and benefits of C/S were discussed.  All questions were answered and informed consent was obtained.  Plan to proceed with low segment transverse Cesarean Section.Multiparity and desires sterility.  Discussed risks and benefits of sterilization including but not limited to risk of tubal failure quoted as 12/998.  She gives her informed consent.    Angela Graham C 06/25/2014, 9:00 AM        This patient has been seen and examined.   All of her questions were answered.  Labs and vital signs reviewed.  Informed consent has been obtained.  The History and Physical is current. 06/29/14 1300 DL

## 2014-06-28 ENCOUNTER — Encounter (HOSPITAL_COMMUNITY)
Admission: RE | Admit: 2014-06-28 | Discharge: 2014-06-28 | Disposition: A | Discharge status: Home / Self Care | Payer: BC Managed Care – PPO | Source: Ambulatory Visit | Attending: Obstetrics and Gynecology | Admitting: Obstetrics and Gynecology

## 2014-06-28 ENCOUNTER — Encounter (HOSPITAL_COMMUNITY): Payer: Self-pay

## 2014-06-28 HISTORY — DX: Encounter for elective termination of pregnancy: Z33.2

## 2014-06-28 LAB — CBC
HCT: 33.8 % — ABNORMAL LOW (ref 36.0–46.0)
Hemoglobin: 11.4 g/dL — ABNORMAL LOW (ref 12.0–15.0)
MCH: 30.2 pg (ref 26.0–34.0)
MCHC: 33.7 g/dL (ref 30.0–36.0)
MCV: 89.4 fL (ref 78.0–100.0)
PLATELETS: 212 10*3/uL (ref 150–400)
RBC: 3.78 MIL/uL — AB (ref 3.87–5.11)
RDW: 13.7 % (ref 11.5–15.5)
WBC: 8.5 10*3/uL (ref 4.0–10.5)

## 2014-06-28 LAB — RPR

## 2014-06-28 MED ORDER — DEXTROSE 5 % IV SOLN
2.0000 g | INTRAVENOUS | Status: AC
  Administered 2014-06-29: 2 g via INTRAVENOUS
  Filled 2014-06-28: qty 2

## 2014-06-29 ENCOUNTER — Inpatient Hospital Stay (HOSPITAL_COMMUNITY)
Admission: RE | Admit: 2014-06-29 | Discharge: 2014-07-01 | DRG: 765 | Disposition: A | Discharge status: Home / Self Care | Payer: BC Managed Care – PPO | Source: Ambulatory Visit | Attending: Obstetrics and Gynecology | Admitting: Obstetrics and Gynecology

## 2014-06-29 ENCOUNTER — Inpatient Hospital Stay (HOSPITAL_COMMUNITY): Payer: BC Managed Care – PPO | Admitting: Anesthesiology

## 2014-06-29 ENCOUNTER — Encounter (HOSPITAL_COMMUNITY)
Admission: RE | Disposition: A | Discharge status: Home / Self Care | Payer: Self-pay | Source: Ambulatory Visit | Attending: Obstetrics and Gynecology

## 2014-06-29 ENCOUNTER — Encounter (HOSPITAL_COMMUNITY): Payer: Self-pay | Admitting: Registered Nurse

## 2014-06-29 DIAGNOSIS — Z302 Encounter for sterilization: Secondary | ICD-10-CM

## 2014-06-29 DIAGNOSIS — O09523 Supervision of elderly multigravida, third trimester: Secondary | ICD-10-CM

## 2014-06-29 DIAGNOSIS — K219 Gastro-esophageal reflux disease without esophagitis: Secondary | ICD-10-CM | POA: Diagnosis present

## 2014-06-29 DIAGNOSIS — O1092 Unspecified pre-existing hypertension complicating childbirth: Secondary | ICD-10-CM | POA: Diagnosis present

## 2014-06-29 DIAGNOSIS — Z9889 Other specified postprocedural states: Secondary | ICD-10-CM | POA: Diagnosis not present

## 2014-06-29 DIAGNOSIS — O3421 Maternal care for scar from previous cesarean delivery: Secondary | ICD-10-CM | POA: Diagnosis present

## 2014-06-29 DIAGNOSIS — Z3A39 39 weeks gestation of pregnancy: Secondary | ICD-10-CM | POA: Diagnosis present

## 2014-06-29 DIAGNOSIS — O458X3 Other premature separation of placenta, third trimester: Principal | ICD-10-CM | POA: Diagnosis present

## 2014-06-29 DIAGNOSIS — Z8249 Family history of ischemic heart disease and other diseases of the circulatory system: Secondary | ICD-10-CM

## 2014-06-29 LAB — PREPARE RBC (CROSSMATCH)

## 2014-06-29 LAB — PLATELET COUNT: PLATELETS: 220 10*3/uL (ref 150–400)

## 2014-06-29 SURGERY — Surgical Case
Anesthesia: Spinal | Laterality: Bilateral

## 2014-06-29 MED ORDER — FENTANYL CITRATE 0.05 MG/ML IJ SOLN
25.0000 ug | INTRAMUSCULAR | Status: DC | PRN
  Administered 2014-06-29 (×2): 50 ug via INTRAVENOUS

## 2014-06-29 MED ORDER — ONDANSETRON HCL 4 MG/2ML IJ SOLN
INTRAMUSCULAR | Status: AC
  Filled 2014-06-29: qty 2

## 2014-06-29 MED ORDER — DEXAMETHASONE SODIUM PHOSPHATE 4 MG/ML IJ SOLN
INTRAMUSCULAR | Status: AC
Start: 2014-06-29 — End: 2014-06-29
  Filled 2014-06-29: qty 1

## 2014-06-29 MED ORDER — NALBUPHINE HCL 10 MG/ML IJ SOLN: 5.0000 mg | Freq: Once | INTRAMUSCULAR | Status: AC | PRN

## 2014-06-29 MED ORDER — NALBUPHINE HCL 10 MG/ML IJ SOLN: 5.0000 mg | INTRAMUSCULAR | Status: DC | PRN

## 2014-06-29 MED ORDER — LACTATED RINGERS IV SOLN
Freq: Once | INTRAVENOUS | Status: AC
  Administered 2014-06-29 (×3): via INTRAVENOUS

## 2014-06-29 MED ORDER — DIPHENHYDRAMINE HCL 25 MG PO CAPS: 25.0000 mg | ORAL_CAPSULE | Freq: Four times a day (QID) | ORAL | Status: DC | PRN

## 2014-06-29 MED ORDER — ZOLPIDEM TARTRATE 5 MG PO TABS: 5.0000 mg | ORAL_TABLET | Freq: Every evening | ORAL | Status: DC | PRN

## 2014-06-29 MED ORDER — PHENYLEPHRINE 8 MG IN D5W 100 ML (0.08MG/ML) PREMIX OPTIME
INJECTION | INTRAVENOUS | Status: DC | PRN
Start: 2014-06-29 — End: 2014-06-29
  Administered 2014-06-29: 60 ug/min via INTRAVENOUS

## 2014-06-29 MED ORDER — SIMETHICONE 80 MG PO CHEW
80.0000 mg | CHEWABLE_TABLET | Freq: Three times a day (TID) | ORAL | Status: DC
  Administered 2014-06-29 – 2014-07-01 (×4): 80 mg via ORAL
  Filled 2014-06-29 (×4): qty 1

## 2014-06-29 MED ORDER — OXYCODONE-ACETAMINOPHEN 5-325 MG PO TABS
1.0000 | ORAL_TABLET | ORAL | Status: DC | PRN
  Administered 2014-06-30 – 2014-07-01 (×7): 1 via ORAL
  Filled 2014-06-29 (×8): qty 1

## 2014-06-29 MED ORDER — SIMETHICONE 80 MG PO CHEW: 80.0000 mg | CHEWABLE_TABLET | ORAL | Status: DC | PRN

## 2014-06-29 MED ORDER — FENTANYL CITRATE 0.05 MG/ML IJ SOLN
INTRAMUSCULAR | Status: AC
  Administered 2014-06-29: 50 ug via INTRAVENOUS
  Filled 2014-06-29: qty 2

## 2014-06-29 MED ORDER — OXYCODONE-ACETAMINOPHEN 5-325 MG PO TABS: 2.0000 | ORAL_TABLET | ORAL | Status: DC | PRN

## 2014-06-29 MED ORDER — ONDANSETRON HCL 4 MG/2ML IJ SOLN: 4.0000 mg | INTRAMUSCULAR | Status: DC | PRN

## 2014-06-29 MED ORDER — LANOLIN HYDROUS EX OINT: 1.0000 "application " | TOPICAL_OINTMENT | CUTANEOUS | Status: DC | PRN

## 2014-06-29 MED ORDER — ONDANSETRON HCL 4 MG/2ML IJ SOLN
INTRAMUSCULAR | Status: DC | PRN
  Administered 2014-06-29: 4 mg via INTRAVENOUS

## 2014-06-29 MED ORDER — MORPHINE SULFATE (PF) 0.5 MG/ML IJ SOLN
INTRAMUSCULAR | Status: DC | PRN
  Administered 2014-06-29: .1 mg via INTRATHECAL

## 2014-06-29 MED ORDER — OXYTOCIN 10 UNIT/ML IJ SOLN
40.0000 [IU] | INTRAVENOUS | Status: DC | PRN
  Administered 2014-06-29: 40 [IU] via INTRAVENOUS

## 2014-06-29 MED ORDER — PROMETHAZINE HCL 25 MG/ML IJ SOLN: 6.2500 mg | INTRAMUSCULAR | Status: DC | PRN

## 2014-06-29 MED ORDER — FENTANYL CITRATE 0.05 MG/ML IJ SOLN
INTRAMUSCULAR | Status: DC | PRN
Start: 2014-06-29 — End: 2014-06-29
  Administered 2014-06-29: 25 ug via INTRATHECAL

## 2014-06-29 MED ORDER — MEPERIDINE HCL 25 MG/ML IJ SOLN: 6.2500 mg | INTRAMUSCULAR | Status: DC | PRN

## 2014-06-29 MED ORDER — PRENATAL MULTIVITAMIN CH
1.0000 | ORAL_TABLET | Freq: Every day | ORAL | Status: DC
  Administered 2014-06-30 – 2014-07-01 (×2): 1 via ORAL
  Filled 2014-06-29 (×2): qty 1

## 2014-06-29 MED ORDER — DEXTROSE 5 % IV SOLN: 1.0000 ug/kg/h | INTRAVENOUS | Status: DC | PRN

## 2014-06-29 MED ORDER — SCOPOLAMINE 1 MG/3DAYS TD PT72: 1.0000 | MEDICATED_PATCH | Freq: Once | TRANSDERMAL | Status: DC

## 2014-06-29 MED ORDER — NALOXONE HCL 0.4 MG/ML IJ SOLN: 0.4000 mg | INTRAMUSCULAR | Status: DC | PRN

## 2014-06-29 MED ORDER — KETOROLAC TROMETHAMINE 30 MG/ML IJ SOLN
15.0000 mg | Freq: Once | INTRAMUSCULAR | Status: AC | PRN
  Administered 2014-06-29: 30 mg via INTRAVENOUS

## 2014-06-29 MED ORDER — DIPHENHYDRAMINE HCL 25 MG PO CAPS: 25.0000 mg | ORAL_CAPSULE | ORAL | Status: DC | PRN

## 2014-06-29 MED ORDER — MENTHOL 3 MG MT LOZG: 1.0000 | LOZENGE | OROMUCOSAL | Status: DC | PRN

## 2014-06-29 MED ORDER — DIBUCAINE 1 % RE OINT: 1.0000 "application " | TOPICAL_OINTMENT | RECTAL | Status: DC | PRN

## 2014-06-29 MED ORDER — OXYTOCIN 10 UNIT/ML IJ SOLN
INTRAMUSCULAR | Status: AC
Start: 2014-06-29 — End: 2014-06-29
  Filled 2014-06-29: qty 4

## 2014-06-29 MED ORDER — 0.9 % SODIUM CHLORIDE (POUR BTL) OPTIME
TOPICAL | Status: DC | PRN
  Administered 2014-06-29: 1000 mL

## 2014-06-29 MED ORDER — LACTATED RINGERS IV SOLN
INTRAVENOUS | Status: DC
  Administered 2014-06-29: 19:00:00 via INTRAVENOUS

## 2014-06-29 MED ORDER — KETOROLAC TROMETHAMINE 30 MG/ML IJ SOLN
30.0000 mg | Freq: Four times a day (QID) | INTRAMUSCULAR | Status: AC | PRN
  Administered 2014-06-29: 30 mg via INTRAVENOUS
  Filled 2014-06-29: qty 1

## 2014-06-29 MED ORDER — SIMETHICONE 80 MG PO CHEW
80.0000 mg | CHEWABLE_TABLET | ORAL | Status: DC
  Administered 2014-06-29 – 2014-07-01 (×2): 80 mg via ORAL
  Filled 2014-06-29 (×2): qty 1

## 2014-06-29 MED ORDER — KETOROLAC TROMETHAMINE 30 MG/ML IJ SOLN
INTRAMUSCULAR | Status: AC
  Filled 2014-06-29: qty 1

## 2014-06-29 MED ORDER — WITCH HAZEL-GLYCERIN EX PADS: 1.0000 "application " | MEDICATED_PAD | CUTANEOUS | Status: DC | PRN

## 2014-06-29 MED ORDER — DEXAMETHASONE SODIUM PHOSPHATE 4 MG/ML IJ SOLN
INTRAMUSCULAR | Status: DC | PRN
  Administered 2014-06-29: 4 mg via INTRAVENOUS

## 2014-06-29 MED ORDER — SCOPOLAMINE 1 MG/3DAYS TD PT72
MEDICATED_PATCH | TRANSDERMAL | Status: AC
  Administered 2014-06-29: 1.5 mg via TRANSDERMAL
  Filled 2014-06-29: qty 1

## 2014-06-29 MED ORDER — ONDANSETRON HCL 4 MG/2ML IJ SOLN: 4.0000 mg | Freq: Three times a day (TID) | INTRAMUSCULAR | Status: DC | PRN

## 2014-06-29 MED ORDER — OXYTOCIN 40 UNITS IN LACTATED RINGERS INFUSION - SIMPLE MED: 62.5000 mL/h | INTRAVENOUS | Status: AC

## 2014-06-29 MED ORDER — IBUPROFEN 600 MG PO TABS
600.0000 mg | ORAL_TABLET | Freq: Four times a day (QID) | ORAL | Status: DC
  Administered 2014-06-30 – 2014-07-01 (×7): 600 mg via ORAL
  Filled 2014-06-29 (×7): qty 1

## 2014-06-29 MED ORDER — NALBUPHINE HCL 10 MG/ML IJ SOLN
5.0000 mg | INTRAMUSCULAR | Status: DC | PRN
  Administered 2014-06-29: 5 mg via SUBCUTANEOUS

## 2014-06-29 MED ORDER — SCOPOLAMINE 1 MG/3DAYS TD PT72
1.0000 | MEDICATED_PATCH | Freq: Once | TRANSDERMAL | Status: DC
  Administered 2014-06-29: 1.5 mg via TRANSDERMAL

## 2014-06-29 MED ORDER — SODIUM CHLORIDE 0.9 % IJ SOLN: 3.0000 mL | INTRAMUSCULAR | Status: DC | PRN

## 2014-06-29 MED ORDER — FENTANYL CITRATE 0.05 MG/ML IJ SOLN
INTRAMUSCULAR | Status: AC
Start: 2014-06-29 — End: 2014-06-29
  Filled 2014-06-29: qty 2

## 2014-06-29 MED ORDER — ONDANSETRON HCL 4 MG PO TABS: 4.0000 mg | ORAL_TABLET | ORAL | Status: DC | PRN

## 2014-06-29 MED ORDER — DIPHENHYDRAMINE HCL 50 MG/ML IJ SOLN: 12.5000 mg | INTRAMUSCULAR | Status: DC | PRN

## 2014-06-29 MED ORDER — SENNOSIDES-DOCUSATE SODIUM 8.6-50 MG PO TABS
2.0000 | ORAL_TABLET | ORAL | Status: DC
  Administered 2014-06-29 – 2014-07-01 (×2): 2 via ORAL
  Filled 2014-06-29: qty 1
  Filled 2014-06-29: qty 2

## 2014-06-29 MED ORDER — PHENYLEPHRINE 8 MG IN D5W 100 ML (0.08MG/ML) PREMIX OPTIME
INJECTION | INTRAVENOUS | Status: AC
  Filled 2014-06-29: qty 100

## 2014-06-29 MED ORDER — MORPHINE SULFATE 0.5 MG/ML IJ SOLN
INTRAMUSCULAR | Status: AC
  Filled 2014-06-29: qty 10

## 2014-06-29 MED ORDER — NALBUPHINE HCL 10 MG/ML IJ SOLN
INTRAMUSCULAR | Status: AC
  Administered 2014-06-29: 5 mg via SUBCUTANEOUS
  Filled 2014-06-29: qty 1

## 2014-06-29 MED ORDER — KETOROLAC TROMETHAMINE 30 MG/ML IJ SOLN: 30.0000 mg | Freq: Four times a day (QID) | INTRAMUSCULAR | Status: AC | PRN

## 2014-06-29 MED ORDER — TETANUS-DIPHTH-ACELL PERTUSSIS 5-2.5-18.5 LF-MCG/0.5 IM SUSP: 0.5000 mL | Freq: Once | INTRAMUSCULAR | Status: DC

## 2014-06-29 SURGICAL SUPPLY — 28 items
CLAMP CORD UMBIL (MISCELLANEOUS) IMPLANT
CLIP FILSHIE TUBAL LIGA STRL (Clip) ×1 IMPLANT
CLOTH BEACON ORANGE TIMEOUT ST (SAFETY) ×2 IMPLANT
DRAPE SHEET LG 3/4 BI-LAMINATE (DRAPES) IMPLANT
DRSG OPSITE POSTOP 4X10 (GAUZE/BANDAGES/DRESSINGS) ×2 IMPLANT
DURAPREP 26ML APPLICATOR (WOUND CARE) ×2 IMPLANT
ELECT REM PT RETURN 9FT ADLT (ELECTROSURGICAL) ×2
ELECTRODE REM PT RTRN 9FT ADLT (ELECTROSURGICAL) ×1 IMPLANT
EXTRACTOR VACUUM M CUP 4 TUBE (SUCTIONS) IMPLANT
GLOVE SURG ORTHO 8.0 STRL STRW (GLOVE) ×2 IMPLANT
GOWN STRL NON-REIN LRG LVL3 (GOWN DISPOSABLE) ×2 IMPLANT
GOWN STRL REUS W/TWL LRG LVL3 (GOWN DISPOSABLE) ×4 IMPLANT
KIT ABG SYR 3ML LUER SLIP (SYRINGE) ×2 IMPLANT
NDL HYPO 25X5/8 SAFETYGLIDE (NEEDLE) ×1 IMPLANT
NEEDLE HYPO 25X5/8 SAFETYGLIDE (NEEDLE) ×2 IMPLANT
NS IRRIG 1000ML POUR BTL (IV SOLUTION) ×2 IMPLANT
PACK C SECTION WH (CUSTOM PROCEDURE TRAY) ×2 IMPLANT
PAD OB MATERNITY 4.3X12.25 (PERSONAL CARE ITEMS) ×2 IMPLANT
SUT MNCRL 0 VIOLET CTX 36 (SUTURE) ×3 IMPLANT
SUT MON AB 4-0 PS1 27 (SUTURE) ×2 IMPLANT
SUT MONOCRYL 0 CTX 36 (SUTURE) ×3
SUT PDS AB 1 CT  36 (SUTURE)
SUT PDS AB 1 CT 36 (SUTURE) IMPLANT
SUT VIC AB 1 CTX 36 (SUTURE)
SUT VIC AB 1 CTX36XBRD ANBCTRL (SUTURE) IMPLANT
TOWEL OR 17X24 6PK STRL BLUE (TOWEL DISPOSABLE) ×2 IMPLANT
TRAY FOLEY CATH 14FR (SET/KITS/TRAYS/PACK) ×2 IMPLANT
WATER STERILE IRR 1000ML POUR (IV SOLUTION) ×2 IMPLANT

## 2014-06-29 NOTE — Anesthesia Procedure Notes (Signed)

## 2014-06-29 NOTE — Anesthesia Preprocedure Evaluation (Addendum)
Anesthesia Evaluation  Patient identified by MRN, date of birth, ID band Patient awake    Reviewed: Allergy & Precautions, H&P , NPO status , Patient's Chart, lab work & pertinent test results, reviewed documented beta blocker date and time   History of Anesthesia Complications (+) PONV and history of anesthetic complications  Airway Mallampati: I  TM Distance: >3 FB Neck ROM: full    Dental  (+) Teeth Intact   Pulmonary neg pulmonary ROS,  breath sounds clear to auscultation        Cardiovascular hypertension (chronic ), Rhythm:regular Rate:Normal     Neuro/Psych negative neurological ROS  negative psych ROS   GI/Hepatic Neg liver ROS, GERD-  Medicated,  Endo/Other  negative endocrine ROS  Renal/GU negative Renal ROS  negative genitourinary   Musculoskeletal   Abdominal   Peds  Hematology negative hematology ROS (+)   Anesthesia Other Findings BP 153/107 today.  H/o CHTN.  Will repeat CBC.  Jasmine DecemberA. Levent Kornegay, MD  Reproductive/Obstetrics (+) Pregnancy (h/o c/s x1, for repeat and BTL)                            Anesthesia Physical Anesthesia Plan  ASA: II  Anesthesia Plan: Spinal   Post-op Pain Management:    Induction:   Airway Management Planned:   Additional Equipment:   Intra-op Plan:   Post-operative Plan:   Informed Consent: I have reviewed the patients History and Physical, chart, labs and discussed the procedure including the risks, benefits and alternatives for the proposed anesthesia with the patient or authorized representative who has indicated his/her understanding and acceptance.     Plan Discussed with: Surgeon and CRNA  Anesthesia Plan Comments:         Anesthesia Quick Evaluation

## 2014-06-29 NOTE — Plan of Care (Signed)
Problem: Phase I Progression Outcomes Goal: Pain controlled with appropriate interventions Outcome: Completed/Met Date Met:  06/29/14 Goal: Foley catheter patent Outcome: Completed/Met Date Met:  06/29/14 Goal: IS, TCDB as ordered Outcome: Completed/Met Date Met:  06/29/14 Goal: VS, stable, temp < 100.4 degrees F Outcome: Completed/Met Date Met:  06/29/14 Goal: Initial discharge plan identified Outcome: Completed/Met Date Met:  06/29/14

## 2014-06-29 NOTE — Lactation Note (Signed)
This note was copied from the chart of Angela Graham Colla. Lactation Consultation Note Initial visit at 8 hours of age. Mom reports good latches, but she isn't seeing colostrum yet and she is concerned.  Mom reports having to supplement with now 4316 months old due to low milk supply.   Hand expression for several minutes and no colostrum visible at this time.  Encouraged mom to continue trying hand expression.  Baby latches well to the breast after bath when placed STS.  Mom is concerned about air space for nose.  Demonstrated position change to allow better latch with nose chin and cheeks touching breast.  MOm denies pain at this time.   [facility] resources given and discussed.  Encouraged to feed with early cues on demand.  Early newborn behavior discussed.  Hand expression demonstrated with colostrum visible.  Mom to call for assist as needed.    Patient Name: Angela Graham Sachs ZOXWR'UToday's Date: 06/29/2014 Reason for consult: Initial assessment   Maternal Data Has patient been taught Hand Expression?: Yes Does the patient have breastfeeding experience prior to this delivery?: Yes  Feeding Feeding Type: Breast Fed Length of feed:  (several minutes observed)  LATCH Score/Interventions Latch: Grasps breast easily, tongue down, lips flanged, rhythmical sucking.  Audible Swallowing: A few with stimulation  Type of Nipple: Everted at rest and after stimulation  Comfort (Breast/Nipple): Soft / non-tender     Hold (Positioning): Assistance needed to correctly position infant at breast and maintain latch. Intervention(s): Breastfeeding basics reviewed;Support Pillows;Position options;Skin to skin  LATCH Score: 8  Lactation Tools Discussed/Used     Consult Status Consult Status: Follow-up Date: 06/30/14 Follow-up type: In-patient    Jannifer RodneyShoptaw, Jana Lynn 06/29/2014, 10:54 PM

## 2014-06-29 NOTE — Anesthesia Postprocedure Evaluation (Signed)
  Anesthesia Post-op Note  Anesthesia Post Note  Patient: Elissa LovettJerilyn E Tomlinson  Procedure(s) Performed: Procedure(s) (LRB): REPEAT CESAREAN SECTION WITH BILATERAL TUBAL LIGATION (Bilateral)  Anesthesia type: Spinal  Patient location: PACU  Post pain: Pain level controlled  Post assessment: Post-op Vital signs reviewed  Last Vitals:  Filed Vitals:   06/29/14 1545  BP:   Pulse: 75  Temp: 36.8 C  Resp: 19    Post vital signs: Reviewed  Level of consciousness: awake  Complications: No apparent anesthesia complications

## 2014-06-29 NOTE — Op Note (Signed)
Cesarean Section Procedure Note  Pre-operative Diagnosis: IUP at 39 weeks, prev C/S for Repeat, desires sterilization  Post-operative Diagnosis: same  Surgeon: Turner DanielsLOWE,Rana Adorno C   Assistants: none  Anesthesia:spinal  Procedure:  Low Segment Transverse cesarean section, BTL with filshie clips  Procedure Details  The patient was seen in the Holding Room. The risks, benefits, complications, treatment options, and expected outcomes were discussed with the patient.  The patient concurred with the proposed plan, giving informed consent.  The site of surgery properly noted/marked.. A Time Out was held and the above information confirmed.  After induction of anesthesia, the patient was draped and prepped in the usual sterile manner. A Pfannenstiel incision was made and carried down through the subcutaneous tissue to the fascia. Fascial incision was made and extended transversely. The fascia was separated from the underlying rectus tissue superiorly and inferiorly. The peritoneum was identified and entered. Peritoneal incision was extended longitudinally. The utero-vesical peritoneal reflection was incised transversely and the bladder flap was bluntly freed from the lower uterine segment. A low transverse uterine incision was made. Delivered from vertex presentation with one easy pull of VE  was a baby with Apgar scores of 9 at one minute and 9 at five minutes. After the umbilical cord was clamped and cut cord blood was obtained for evaluation. The placenta was removed intact and appeared normal. The uterine outline, tubes and ovaries appeared normal. The uterine incision was closed with running locked sutures of 0 monocryl and imbricated with 0 monocryl. Hemostasis was observed. Lavage was carried out until clear.The fallopian tubes were identified by their fimbriated ends and filshie clips were placed across the tubes at the midportion of each tube.  Good placement was noted bilaterally.  The peritoneum was  then closed with 0 monocryl and rectus muscles plicated in the midline.  After hemostasis was assured, the fascia was then reapproximated with running sutures of 0 Vicryl. Irrigation was applied and after adequate hemostasis was assured, the skin was reapproximated with staples.  Instrument, sponge, and needle counts were correct prior the abdominal closure and at the conclusion of the case. The patient received 2 grams cefotetan preoperatively.  Findings: Viable female  Estimated Blood Loss:  700cc         Specimens: Placenta was sent to Pathology         Complications:  None

## 2014-06-29 NOTE — Transfer of Care (Signed)
Immediate Anesthesia Transfer of Care Note  Patient: Angela LovettJerilyn E Bhullar  Procedure(s) Performed: Procedure(s) with comments: REPEAT CESAREAN SECTION WITH BILATERAL TUBAL LIGATION (Bilateral) - repeat edc 07/04/14  Patient Location: PACU  Anesthesia Type:Spinal  Level of Consciousness: awake, alert  and oriented  Airway & Oxygen Therapy: Patient Spontanous Breathing  Post-op Assessment: Report given to PACU RN  Post vital signs: Reviewed  Complications: No apparent anesthesia complications

## 2014-06-30 ENCOUNTER — Encounter (HOSPITAL_COMMUNITY): Payer: Self-pay | Admitting: Obstetrics and Gynecology

## 2014-06-30 LAB — CBC
HCT: 30.1 % — ABNORMAL LOW (ref 36.0–46.0)
Hemoglobin: 10.3 g/dL — ABNORMAL LOW (ref 12.0–15.0)
MCH: 30.4 pg (ref 26.0–34.0)
MCHC: 34.2 g/dL (ref 30.0–36.0)
MCV: 88.8 fL (ref 78.0–100.0)
PLATELETS: 195 10*3/uL (ref 150–400)
RBC: 3.39 MIL/uL — AB (ref 3.87–5.11)
RDW: 13.5 % (ref 11.5–15.5)
WBC: 13.2 10*3/uL — ABNORMAL HIGH (ref 4.0–10.5)

## 2014-06-30 NOTE — Anesthesia Postprocedure Evaluation (Signed)
  Anesthesia Post-op Note  Patient: Angela LovettJerilyn E Ribble  Procedure(s) Performed: Procedure(s) with comments: REPEAT CESAREAN SECTION WITH BILATERAL TUBAL LIGATION (Bilateral) - repeat edc 07/04/14  Patient Location: Mother/Baby  Anesthesia Type:Spinal  Level of Consciousness: awake, alert  and oriented  Airway and Oxygen Therapy: Patient Spontanous Breathing  Post-op Pain: none  Post-op Assessment: Post-op Vital signs reviewed, Patient's Cardiovascular Status Stable, Respiratory Function Stable, No headache, No backache, No residual numbness and No residual motor weakness  Post-op Vital Signs: Reviewed and stable  Last Vitals:  Filed Vitals:   06/30/14 0510  BP: 140/83  Pulse: 66  Temp: 36.9 C  Resp: 18    Complications: No apparent anesthesia complications

## 2014-06-30 NOTE — Lactation Note (Signed)
This note was copied from the chart of Angela Graham. Lactation Consultation Note    Follow up consult with this mom of a term baby, now weighing 5 lbs 15.2 ounces. He is cluster feeding, at 25 hours post partum, and is alert and vigorous at the breast. i assisted mom with better positioning, to [protect her nipples and allow baby to transfer better. She was holding the baby supine in cross cradle, and I positioned the baby belly to belly with mom, and had her move the baby's head up toward her breast, to obtain a deeper latch. I also supported mom and baby with pillows under each arm. Mom reports her nipples feeling much more comfortable this way. I also did hand expression and showed mom how to do this, and she has fairly easily expressed colostrum. This made mom feel better, since she is worried about her milk supply. I also gave mom a hand pump and foley cup,and instructed her in their use. i told mom that if he keeps feeding well, she may not need to pump extra, but I gave it to her since he is under 6 pounds. Mom's nipple was originally pinched when baby cam off the breast, but since mom could feel the difference in the deeper latch, I am hoping this will not continue to happen. Mom knows to call for questions/concerns.   Patient Name: Angela Barbara CowerJerilyn Posada UJWJX'BToday's Date: 06/30/2014 Reason for consult: Follow-up assessment   Maternal Data    Feeding Feeding Type: Breast Fed Length of feed: 20 min (mom reports baby eats continuously)  LATCH Score/Interventions Latch: Grasps breast easily, tongue down, lips flanged, rhythmical sucking.  Audible Swallowing: A few with stimulation Intervention(s): Hand expression  Type of Nipple: Everted at rest and after stimulation  Comfort (Breast/Nipple): Soft / non-tender     Hold (Positioning): Assistance needed to correctly position infant at breast and maintain latch. Intervention(s): Breastfeeding basics reviewed;Support Pillows;Position  options;Skin to skin  LATCH Score: 8  Lactation Tools Discussed/Used     Consult Status Consult Status: Follow-up Date: 07/01/14 Follow-up type: In-patient    Alfred LevinsLee, Odyn Turko Anne 06/30/2014, 3:34 PM

## 2014-06-30 NOTE — Progress Notes (Signed)
Subjective: Postpartum Day 1: Cesarean Delivery Patient reports tolerating PO and no problems voiding.    Objective: Vital signs in last 24 hours: Temp:  [98.1 F (36.7 C)-98.9 F (37.2 C)] 98.4 F (36.9 C) (11/04 0510) Pulse Rate:  [66-101] 66 (11/04 0510) Resp:  [14-27] 18 (11/04 0510) BP: (123-153)/(74-107) 140/83 mmHg (11/04 0510) SpO2:  [96 %-100 %] 100 % (11/04 0510) Weight:  [189 lb (85.73 kg)] 189 lb (85.73 kg) (11/03 1957)  Physical Exam:  General: alert and cooperative Lochia: appropriate Uterine Fundus: firm Incision: healing well DVT Evaluation: No evidence of DVT seen on physical exam. Negative Homan's sign. No cords or calf tenderness. No significant calf/ankle edema.   Recent Labs  06/28/14 1448 06/30/14 0625  HGB 11.4* 10.3*  HCT 33.8* 30.1*    Assessment/Plan: Status post Cesarean section. Doing well postoperatively.  Continue current care.  Angela Graham 06/30/2014, 8:43 AM

## 2014-06-30 NOTE — Addendum Note (Signed)
Addendum  created 06/30/14 [id number] by Shanon PayorSuzanne M Reggie Welge, CRNA   Modules edited: Notes Section   Notes Section:  File: [id number]

## 2014-07-01 MED ORDER — OXYCODONE-ACETAMINOPHEN 5-325 MG PO TABS
1.0000 | ORAL_TABLET | ORAL | Status: DC | PRN
Start: 2014-07-01 — End: 2017-02-08

## 2014-07-01 MED ORDER — IBUPROFEN 600 MG PO TABS: 600.0000 mg | ORAL_TABLET | Freq: Four times a day (QID) | ORAL | Status: DC

## 2014-07-01 NOTE — Plan of Care (Signed)
Problem: Discharge Progression Outcomes Goal: Afebrile, VS remain stable at discharge Outcome: Completed/Met Date Met:  07/01/14 Goal: Remove staples per MD order Outcome: Completed/Met Date Met:  07/01/14 Goal: Discharge plan in place and appropriate Outcome: Completed/Met Date Met:  07/01/14 Goal: Other Discharge Outcomes/Goals Outcome: Completed/Met Date Met:  07/01/14

## 2014-07-01 NOTE — Discharge Summary (Signed)
Obstetric Discharge Summary Reason for Admission: cesarean section Prenatal Procedures: ultrasound Intrapartum Procedures: cesarean: low cervical, transverse Postpartum Procedures: none Complications-Operative and Postpartum: none HEMOGLOBIN  Date Value Ref Range Status  06/30/2014 10.3* 12.0 - 15.0 g/dL Final   HCT  Date Value Ref Range Status  06/30/2014 30.1* 36.0 - 46.0 % Final    Physical Exam:  General: alert and cooperative Lochia: appropriate Uterine Fundus: firm Incision: healing well DVT Evaluation: No evidence of DVT seen on physical exam. Negative Homan's sign. No cords or calf tenderness. No significant calf/ankle edema.  Discharge Diagnoses: Term Pregnancy-delivered and BTL  Discharge Information: Date: 07/01/2014 Activity: pelvic rest Diet: routine Medications: PNV, Ibuprofen and Percocet Condition: stable Instructions: refer to practice specific booklet Discharge to: home   Newborn Data: Live born female  Birth Weight: 6 lb 1 oz (2750 g) APGAR: 9, 9  Home with mother.  Angela Graham G 07/01/2014, 8:31 AM

## 2014-07-01 NOTE — Plan of Care (Signed)
Problem: Discharge Progression Outcomes Goal: Afebrile, VS remain stable at discharge Outcome: Completed/Met Date Met:  07/01/14 Goal: Remove staples per MD order Outcome: Not Applicable Date Met:  34/96/11 Goal: Discharge plan in place and appropriate Outcome: Completed/Met Date Met:  07/01/14 Goal: Other Discharge Outcomes/Goals Outcome: Completed/Met Date Met:  07/01/14

## 2014-07-01 NOTE — Plan of Care (Signed)
Problem: Consults Goal: Postpartum Patient Education (See Patient Education module for education specifics.)  Outcome: Completed/Met Date Met:  07/01/14  Problem: Phase I Progression Outcomes Goal: Voiding adequately Outcome: Completed/Met Date Met:  07/01/14 Goal: OOB as tolerated unless otherwise ordered Outcome: Completed/Met Date Met:  07/01/14 Goal: Other Phase I Outcomes/Goals Outcome: Completed/Met Date Met:  07/01/14  Problem: Phase II Progression Outcomes Goal: Pain controlled on oral analgesia Outcome: Completed/Met Date Met:  07/01/14 Goal: Progress activity as tolerated unless otherwise ordered Outcome: Completed/Met Date Met:  07/01/14 Goal: Afebrile, VS remain stable Outcome: Completed/Met Date Met:  07/01/14 Goal: Incision intact & without signs/symptoms of infection Outcome: Completed/Met Date Met:  07/01/14 Goal: Tolerating diet Outcome: Completed/Met Date Met:  07/01/14 Goal: Other Phase II Outcomes/Goals Outcome: Completed/Met Date Met:  07/01/14  Problem: Discharge Progression Outcomes Goal: Tolerating diet Outcome: Completed/Met Date Met:  07/01/14

## 2014-07-01 NOTE — Plan of Care (Signed)
Problem: Discharge Progression Outcomes Goal: Barriers To Progression Addressed/Resolved Outcome: Completed/Met Date Met:  07/01/14 Goal: Activity appropriate for discharge plan Outcome: Completed/Met Date Met:  55/25/89 Goal: Complications resolved/controlled Outcome: Completed/Met Date Met:  07/01/14 Goal: Pain controlled with appropriate interventions Outcome: Completed/Met Date Met:  07/01/14

## 2014-07-02 LAB — TYPE AND SCREEN
ABO/RH(D): A POS
Antibody Screen: NEGATIVE
Unit division: 0
Unit division: 0

## 2015-12-30 DIAGNOSIS — Z01419 Encounter for gynecological examination (general) (routine) without abnormal findings: Secondary | ICD-10-CM | POA: Diagnosis not present

## 2016-12-07 DIAGNOSIS — M5416 Radiculopathy, lumbar region: Secondary | ICD-10-CM | POA: Diagnosis not present

## 2016-12-10 DIAGNOSIS — M5416 Radiculopathy, lumbar region: Secondary | ICD-10-CM | POA: Diagnosis not present

## 2016-12-20 DIAGNOSIS — M5416 Radiculopathy, lumbar region: Secondary | ICD-10-CM | POA: Diagnosis not present

## 2016-12-27 DIAGNOSIS — I10 Essential (primary) hypertension: Secondary | ICD-10-CM | POA: Diagnosis not present

## 2017-02-08 ENCOUNTER — Emergency Department (HOSPITAL_COMMUNITY): Payer: BLUE CROSS/BLUE SHIELD

## 2017-02-08 ENCOUNTER — Emergency Department (HOSPITAL_COMMUNITY)
Admission: EM | Admit: 2017-02-08 | Discharge: 2017-02-09 | Disposition: A | Discharge status: Home / Self Care | Payer: BLUE CROSS/BLUE SHIELD | Attending: Emergency Medicine | Admitting: Emergency Medicine

## 2017-02-08 ENCOUNTER — Encounter (HOSPITAL_COMMUNITY): Payer: Self-pay | Admitting: Nurse Practitioner

## 2017-02-08 DIAGNOSIS — I10 Essential (primary) hypertension: Secondary | ICD-10-CM | POA: Insufficient documentation

## 2017-02-08 DIAGNOSIS — Z79899 Other long term (current) drug therapy: Secondary | ICD-10-CM | POA: Diagnosis not present

## 2017-02-08 DIAGNOSIS — R1031 Right lower quadrant pain: Secondary | ICD-10-CM | POA: Diagnosis not present

## 2017-02-08 DIAGNOSIS — N23 Unspecified renal colic: Secondary | ICD-10-CM | POA: Insufficient documentation

## 2017-02-08 DIAGNOSIS — N200 Calculus of kidney: Secondary | ICD-10-CM | POA: Diagnosis not present

## 2017-02-08 LAB — LIPASE, BLOOD: Lipase: 27 U/L (ref 11–51)

## 2017-02-08 LAB — URINALYSIS, ROUTINE W REFLEX MICROSCOPIC
BILIRUBIN URINE: NEGATIVE
Glucose, UA: NEGATIVE mg/dL
HGB URINE DIPSTICK: NEGATIVE
KETONES UR: 20 mg/dL — AB
Leukocytes, UA: NEGATIVE
Nitrite: NEGATIVE
PH: 7 (ref 5.0–8.0)
Protein, ur: NEGATIVE mg/dL
Specific Gravity, Urine: 1.017 (ref 1.005–1.030)

## 2017-02-08 LAB — COMPREHENSIVE METABOLIC PANEL
ALT: 20 U/L (ref 14–54)
AST: 22 U/L (ref 15–41)
Albumin: 4.9 g/dL (ref 3.5–5.0)
Alkaline Phosphatase: 57 U/L (ref 38–126)
Anion gap: 9 (ref 5–15)
BUN: 11 mg/dL (ref 6–20)
CHLORIDE: 105 mmol/L (ref 101–111)
CO2: 23 mmol/L (ref 22–32)
Calcium: 9.5 mg/dL (ref 8.9–10.3)
Creatinine, Ser: 0.69 mg/dL (ref 0.44–1.00)
GFR calc Af Amer: >60 mL/min (ref >60)
GFR calc non Af Amer: >60 mL/min (ref >60)
GLUCOSE: 117 mg/dL — AB (ref 65–99)
POTASSIUM: 4.6 mmol/L (ref 3.5–5.1)
SODIUM: 137 mmol/L (ref 135–145)
Total Bilirubin: 0.7 mg/dL (ref 0.3–1.2)
Total Protein: 8.2 g/dL — ABNORMAL HIGH (ref 6.5–8.1)

## 2017-02-08 LAB — CBC
HEMATOCRIT: 38 % (ref 36.0–46.0)
Hemoglobin: 13.1 g/dL (ref 12.0–15.0)
MCH: 30.2 pg (ref 26.0–34.0)
MCHC: 34.5 g/dL (ref 30.0–36.0)
MCV: 87.6 fL (ref 78.0–100.0)
Platelets: 243 10*3/uL (ref 150–400)
RBC: 4.34 MIL/uL (ref 3.87–5.11)
RDW: 13 % (ref 11.5–15.5)
WBC: 9 10*3/uL (ref 4.0–10.5)

## 2017-02-08 MED ORDER — SODIUM CHLORIDE 0.9 % IV BOLUS (SEPSIS)
500.0000 mL | Freq: Once | INTRAVENOUS | Status: AC
  Administered 2017-02-08: 500 mL via INTRAVENOUS

## 2017-02-08 MED ORDER — IOPAMIDOL (ISOVUE-300) INJECTION 61%
INTRAVENOUS | Status: AC
  Filled 2017-02-08: qty 100

## 2017-02-08 MED ORDER — IOPAMIDOL (ISOVUE-300) INJECTION 61%
100.0000 mL | Freq: Once | INTRAVENOUS | Status: AC | PRN
  Administered 2017-02-08: 100 mL via INTRAVENOUS

## 2017-02-08 NOTE — ED Notes (Signed)
Pt c/o 8/10 RUQ abd pain, nausea but no vomiting, and chills onset noon today. Pt has taken 2x500 tylenol today at 1800, but no effect.

## 2017-02-08 NOTE — ED Provider Notes (Signed)
[facility]-EMERGENCY DEPT Provider Note   CSN: [id number] Arrival date & time: 02/08/17  2011     History   Chief Complaint Chief Complaint  Patient presents with  . Abdominal Pain    HPI Angela Graham is a 41 y.o. female.  Patient with history of HTN presents with RLQ abdominal pain that started earlier today and associated with nausea. No fever, urinary symptoms, vaginal discharge or irregular bleeding. She denies past ovarian problems, including cysts. She tried Tylenol 1000 mg without relief. Last bowel movement was earlier this evening and did not change the character of the pain. She tried to eat earlier and this was associated with nausea, no vomiting, but did not affect the pain. No history of similar symptoms previously.   The history is provided by the patient. No language interpreter was used.    Past Medical History:  Diagnosis Date  . Anemia    as a teenager  . HTN in pregnancy, chronic 01/29/2013   History - resolved after delivery  . Hypertension    no meds  . PONV (postoperative nausea and vomiting)   . Termination of pregnancy (fetus) 1996, 2003   x 2    Patient Active Problem List   Diagnosis Date Noted  . Antepartum bleeding 04/03/2014  . Second trimester bleeding 02/21/2014  . Cesarean delivery delivered 02/10/2013  . HTN in pregnancy, chronic 01/29/2013  . Stress fracture of foot 03/28/2011    Past Surgical History:  Procedure Laterality Date  . CESAREAN SECTION N/A 02/09/2013   Procedure: Primary CESAREAN SECTION of baby girl at 0149 APGAR 7/9;  Surgeon: Purcell Nails, MD;  Location: [facility];  Service: Obstetrics;  Laterality: N/A;  . CESAREAN SECTION WITH BILATERAL TUBAL LIGATION Bilateral 06/29/2014   Procedure: REPEAT CESAREAN SECTION WITH BILATERAL TUBAL LIGATION;  Surgeon: Turner Daniels, MD;  Location: [facility];  Service: Obstetrics;  Laterality: Bilateral;  repeat edc 07/04/14  . DILATION AND CURETTAGE OF UTERUS     x 2 EAB  . WISDOM TOOTH  EXTRACTION    . WISDOM TOOTH EXTRACTION      OB History    Gravida Para Term Preterm AB Living   4 2 2   2 2    SAB TAB Ectopic Multiple Live Births         0 2       Home Medications    Prior to Admission medications   Medication Sig Start Date End Date Taking? Authorizing Provider  acetaminophen (TYLENOL) 500 MG tablet Take 1,000 mg by mouth every 6 (six) hours as needed.   Yes [provider]  hydrochlorothiazide (HYDRODIURIL) 12.5 MG tablet Take 12.5 mg by mouth daily.   Yes [provider]  Multiple Vitamin (MULTIVITAMIN) tablet Take 1 tablet by mouth daily.   Yes [provider]    Family History Family History  Problem Relation Age of Onset  . Depression Mother   . Hypertension Mother   . Cancer Father 74       prostate  . Hypertension Brother   . Depression Maternal Grandmother   . Heart disease Maternal Grandmother        CHF  . Hypertension Maternal Grandmother   . Stroke Maternal Grandmother   . Cancer Maternal Grandfather        brain  . Cancer Paternal Grandfather        colon  . Hypertension Brother   . Cancer Paternal Uncle  prostate    Social History Social History  Substance Use Topics  . Smoking status: Never Smoker  . Smokeless tobacco: Never Used  . Alcohol use No     Comment: none since + UPT     Allergies   Patient has no known allergies.   Review of Systems Review of Systems  Constitutional: Negative for chills and fever.  Cardiovascular: Negative.   Gastrointestinal: Positive for abdominal pain and nausea. Negative for constipation and vomiting.  Genitourinary: Negative for dysuria, pelvic pain, vaginal bleeding and vaginal discharge.  Musculoskeletal: Positive for back pain.  Skin: Negative.   Neurological: Negative.      Physical Exam Updated Vital Signs BP (!) 165/107 (BP Location: Left Arm)   Pulse (!) 103   Temp 98.7 F (37.1 C) (Oral)   Resp 18   Ht 5\' 7"  (1.702 m)   Wt 72.6 kg  (160 lb)   SpO2 100%   BMI 25.06 kg/m   Physical Exam  Constitutional: She is oriented to person, place, and time. She appears well-developed and well-nourished.  HENT:  Head: Normocephalic.  Neck: Normal range of motion. Neck supple.  Pulmonary/Chest: Effort normal.  Abdominal: Soft. She exhibits no distension. There is tenderness (Tenderness limited to RLQ abdomen). There is no rebound and no guarding.  Musculoskeletal: Normal range of motion.  Neurological: She is alert and oriented to person, place, and time.  Skin: Skin is warm and dry.  Psychiatric: She has a normal mood and affect.     ED Treatments / Results  Labs (all labs ordered are listed, but only abnormal results are displayed) Labs Reviewed  COMPREHENSIVE METABOLIC PANEL - Abnormal; Notable for the following:       Result Value   Glucose, Bld 117 (*)    Total Protein 8.2 (*)    All other components within normal limits  URINALYSIS, ROUTINE W REFLEX MICROSCOPIC - Abnormal; Notable for the following:    Ketones, ur 20 (*)    All other components within normal limits  LIPASE, BLOOD  CBC  PREGNANCY, URINE   Results for orders placed or performed during the hospital encounter of 02/08/17  Lipase, blood  Result Value Ref Range   Lipase 27 11 - 51 U/L  Comprehensive metabolic panel  Result Value Ref Range   Sodium 137 135 - 145 mmol/L   Potassium 4.6 3.5 - 5.1 mmol/L   Chloride 105 101 - 111 mmol/L   CO2 23 22 - 32 mmol/L   Glucose, Bld 117 (H) 65 - 99 mg/dL   BUN 11 6 - 20 mg/dL   Creatinine, Ser 1.61 0.44 - 1.00 mg/dL   Calcium 9.5 8.9 - 09.6 mg/dL   Total Protein 8.2 (H) 6.5 - 8.1 g/dL   Albumin 4.9 3.5 - 5.0 g/dL   AST 22 15 - 41 U/L   ALT 20 14 - 54 U/L   Alkaline Phosphatase 57 38 - 126 U/L   Total Bilirubin 0.7 0.3 - 1.2 mg/dL   GFR calc non Af Amer >60 >60 mL/min   GFR calc Af Amer >60 >60 mL/min   Anion gap 9 5 - 15  CBC  Result Value Ref Range   WBC 9.0 4.0 - 10.5 K/uL   RBC 4.34 3.87 -  5.11 MIL/uL   Hemoglobin 13.1 12.0 - 15.0 g/dL   HCT 04.5 40.9 - 81.1 %   MCV 87.6 78.0 - 100.0 fL   MCH 30.2 26.0 - 34.0 pg   MCHC  34.5 30.0 - 36.0 g/dL   RDW 16.1 09.6 - 04.5 %   Platelets 243 150 - 400 K/uL  Urinalysis, Routine w reflex microscopic  Result Value Ref Range   Color, Urine YELLOW YELLOW   APPearance CLEAR CLEAR   Specific Gravity, Urine 1.017 1.005 - 1.030   pH 7.0 5.0 - 8.0   Glucose, UA NEGATIVE NEGATIVE mg/dL   Hgb urine dipstick NEGATIVE NEGATIVE   Bilirubin Urine NEGATIVE NEGATIVE   Ketones, ur 20 (A) NEGATIVE mg/dL   Protein, ur NEGATIVE NEGATIVE mg/dL   Nitrite NEGATIVE NEGATIVE   Leukocytes, UA NEGATIVE NEGATIVE   Ct Abdomen Pelvis W Contrast  Result Date: 02/09/2017 CLINICAL DATA:  Acute onset of severe right lower quadrant abdominal pain and tenderness. Nausea. Initial encounter. EXAM: CT ABDOMEN AND PELVIS WITH CONTRAST TECHNIQUE: Multidetector CT imaging of the abdomen and pelvis was performed using the standard protocol following bolus administration of intravenous contrast. CONTRAST:  ISOVUE-300 IOPAMIDOL (ISOVUE-300) INJECTION 61% COMPARISON:  Pelvic ultrasound performed 04/15/2014 FINDINGS: Lower chest: The visualized lung bases are grossly clear. The visualized portions of the mediastinum are unremarkable. Hepatobiliary: A 7 mm nonspecific hypodensity is noted at the right hepatic lobe. The liver is otherwise unremarkable. The gallbladder is unremarkable in appearance. The common bile duct remains normal in caliber. Pancreas: The pancreas is within normal limits. Spleen: The spleen is unremarkable in appearance. Adrenals/Urinary Tract: The adrenal glands are unremarkable in appearance. Scattered nonobstructing right renal stones are seen, measuring up to 5 mm in size. The left kidney is unremarkable in appearance. There is no evidence of hydronephrosis. No obstructing ureteral stones are identified. No perinephric stranding is seen. Stomach/Bowel: The  stomach is unremarkable in appearance. The small bowel is within normal limits. The appendix is normal in caliber, without evidence of appendicitis. The colon is unremarkable in appearance. Vascular/Lymphatic: The abdominal aorta is unremarkable in appearance. The inferior vena cava is grossly unremarkable. No retroperitoneal lymphadenopathy is seen. No pelvic sidewall lymphadenopathy is identified. Reproductive: The bladder is moderately distended and grossly remarkable. The uterus is unremarkable in appearance. The the ovaries are grossly symmetric. No suspicious adnexal masses are seen. Bilateral tubal ligation clips are noted. Other: No additional soft tissue abnormalities are seen. Musculoskeletal: No acute osseous abnormalities are identified. The visualized musculature is unremarkable in appearance. IMPRESSION: 1. No acute abnormality seen within the abdomen or pelvis. No evidence of appendicitis. 2. Scattered nonobstructing right renal stones measure up to 5 mm in size. No evidence of hydronephrosis. 3. 7 mm nonspecific hypodensity at the right hepatic lobe. Electronically Signed   By: Roanna Raider M.D.   On: 02/09/2017 00:07    EKG  EKG Interpretation None       Radiology No results found.  Procedures Procedures (including critical care time)  Medications Ordered in ED Medications  sodium chloride 0.9 % bolus 500 mL (not administered)     Initial Impression / Assessment and Plan / ED Course  I have reviewed the triage vital signs and the nursing notes.  Pertinent labs & imaging results that were available during my care of the patient were reviewed by me and considered in my medical decision making (see chart for details).     Patient with onset right abdominal pain today causing nausea. No fever, anorexia, urinary or vaginal symptoms. Labs are essentially normal. CT scan showing renal stones but no ureteral stones. UA negative for blood, however, feel the patient's symptoms  are consistent with ureteral colic. Appy normal.  No other abnormality noted on CT scan. VSS. Pain has significantly improved in the ED without intervention.   She can be discharged home with prn Rx for Zofran. She does not feel she needs pain medications. Will provided referral to urology if symptoms become recurrent.   Final Clinical Impressions(s) / ED Diagnoses   Final diagnoses:  None   1. RLQ abdominal pain 2. HTN  New Prescriptions New Prescriptions   No medications on file     Elpidio AnisUpstill, Kaitlyn Skowron, Cordelia Poche-C 02/09/17 0049    Melene PlanFloyd, Dan, DO 02/09/17 0124

## 2017-02-08 NOTE — ED Triage Notes (Signed)
Pt is c/o severe RLQ abdominal pain and tenderness with sudden onset today at lunch time. Denies any GI hx, also c/o nausea without emesis.

## 2017-02-09 MED ORDER — KETOROLAC TROMETHAMINE 30 MG/ML IJ SOLN
15.0000 mg | Freq: Once | INTRAMUSCULAR | Status: AC
  Administered 2017-02-09: 15 mg via INTRAVENOUS
  Filled 2017-02-09: qty 1

## 2017-02-09 MED ORDER — ONDANSETRON 4 MG PO TBDP: 4.0000 mg | ORAL_TABLET | Freq: Three times a day (TID) | ORAL | 0 refills | Status: DC | PRN

## 2017-02-09 MED ORDER — HYDROCHLOROTHIAZIDE 25 MG PO TABS: 12.5000 mg | ORAL_TABLET | Freq: Every day | ORAL | 0 refills | Status: DC

## 2017-02-09 NOTE — ED Notes (Signed)
Patient ambulated to bathroom.

## 2017-04-18 DIAGNOSIS — Z6825 Body mass index (BMI) 25.0-25.9, adult: Secondary | ICD-10-CM | POA: Diagnosis not present

## 2017-04-18 DIAGNOSIS — Z01419 Encounter for gynecological examination (general) (routine) without abnormal findings: Secondary | ICD-10-CM | POA: Diagnosis not present

## 2017-04-23 DIAGNOSIS — M5416 Radiculopathy, lumbar region: Secondary | ICD-10-CM | POA: Diagnosis not present

## 2017-07-04 DIAGNOSIS — I10 Essential (primary) hypertension: Secondary | ICD-10-CM | POA: Diagnosis not present

## 2017-07-04 DIAGNOSIS — Z23 Encounter for immunization: Secondary | ICD-10-CM | POA: Diagnosis not present

## 2017-07-04 DIAGNOSIS — R1013 Epigastric pain: Secondary | ICD-10-CM | POA: Diagnosis not present

## 2017-07-04 DIAGNOSIS — Z0000 Encounter for general adult medical examination without abnormal findings: Secondary | ICD-10-CM | POA: Diagnosis not present

## 2017-07-25 ENCOUNTER — Other Ambulatory Visit: Payer: Self-pay | Admitting: Family Medicine

## 2017-07-25 ENCOUNTER — Ambulatory Visit
Admission: RE | Admit: 2017-07-25 | Discharge: 2017-07-25 | Disposition: A | Discharge status: Home / Self Care | Payer: BLUE CROSS/BLUE SHIELD | Source: Ambulatory Visit | Attending: Family Medicine | Admitting: Family Medicine

## 2017-07-25 DIAGNOSIS — R220 Localized swelling, mass and lump, head: Secondary | ICD-10-CM | POA: Diagnosis not present

## 2017-07-25 DIAGNOSIS — R229 Localized swelling, mass and lump, unspecified: Principal | ICD-10-CM

## 2017-07-25 DIAGNOSIS — IMO0002 Reserved for concepts with insufficient information to code with codable children: Secondary | ICD-10-CM

## 2017-07-31 ENCOUNTER — Other Ambulatory Visit: Payer: Self-pay | Admitting: Family Medicine

## 2017-07-31 DIAGNOSIS — R930 Abnormal findings on diagnostic imaging of skull and head, not elsewhere classified: Secondary | ICD-10-CM

## 2017-08-05 ENCOUNTER — Other Ambulatory Visit: Payer: BLUE CROSS/BLUE SHIELD

## 2017-08-09 ENCOUNTER — Other Ambulatory Visit: Payer: BLUE CROSS/BLUE SHIELD

## 2017-08-13 ENCOUNTER — Ambulatory Visit
Admission: RE | Admit: 2017-08-13 | Discharge: 2017-08-13 | Disposition: A | Discharge status: Home / Self Care | Payer: BLUE CROSS/BLUE SHIELD | Source: Ambulatory Visit | Attending: Family Medicine | Admitting: Family Medicine

## 2017-08-13 DIAGNOSIS — R930 Abnormal findings on diagnostic imaging of skull and head, not elsewhere classified: Secondary | ICD-10-CM

## 2017-08-13 DIAGNOSIS — R42 Dizziness and giddiness: Secondary | ICD-10-CM | POA: Diagnosis not present

## 2018-02-26 DIAGNOSIS — N939 Abnormal uterine and vaginal bleeding, unspecified: Secondary | ICD-10-CM | POA: Diagnosis not present

## 2018-02-26 DIAGNOSIS — R102 Pelvic and perineal pain: Secondary | ICD-10-CM | POA: Diagnosis not present

## 2018-05-19 DIAGNOSIS — Z8042 Family history of malignant neoplasm of prostate: Secondary | ICD-10-CM | POA: Diagnosis not present

## 2018-05-19 DIAGNOSIS — Z803 Family history of malignant neoplasm of breast: Secondary | ICD-10-CM | POA: Diagnosis not present

## 2018-05-19 DIAGNOSIS — Z1231 Encounter for screening mammogram for malignant neoplasm of breast: Secondary | ICD-10-CM | POA: Diagnosis not present

## 2018-05-19 DIAGNOSIS — Z6827 Body mass index (BMI) 27.0-27.9, adult: Secondary | ICD-10-CM | POA: Diagnosis not present

## 2018-05-19 DIAGNOSIS — Z01419 Encounter for gynecological examination (general) (routine) without abnormal findings: Secondary | ICD-10-CM | POA: Diagnosis not present

## 2018-05-20 ENCOUNTER — Other Ambulatory Visit: Payer: Self-pay

## 2018-05-20 ENCOUNTER — Other Ambulatory Visit: Payer: Self-pay | Admitting: Obstetrics and Gynecology

## 2018-05-20 DIAGNOSIS — R928 Other abnormal and inconclusive findings on diagnostic imaging of breast: Secondary | ICD-10-CM

## 2018-05-21 ENCOUNTER — Ambulatory Visit
Admission: RE | Admit: 2018-05-21 | Discharge: 2018-05-21 | Disposition: A | Discharge status: Home / Self Care | Payer: BLUE CROSS/BLUE SHIELD | Source: Ambulatory Visit | Attending: Obstetrics and Gynecology | Admitting: Obstetrics and Gynecology

## 2018-05-21 ENCOUNTER — Other Ambulatory Visit: Payer: Self-pay | Admitting: Obstetrics and Gynecology

## 2018-05-21 DIAGNOSIS — R922 Inconclusive mammogram: Secondary | ICD-10-CM | POA: Diagnosis not present

## 2018-05-21 DIAGNOSIS — R928 Other abnormal and inconclusive findings on diagnostic imaging of breast: Secondary | ICD-10-CM

## 2018-05-21 DIAGNOSIS — N6310 Unspecified lump in the right breast, unspecified quadrant: Secondary | ICD-10-CM | POA: Diagnosis not present

## 2018-05-23 ENCOUNTER — Ambulatory Visit
Admission: RE | Admit: 2018-05-23 | Discharge: 2018-05-23 | Disposition: A | Discharge status: Home / Self Care | Payer: BLUE CROSS/BLUE SHIELD | Source: Ambulatory Visit | Attending: Obstetrics and Gynecology | Admitting: Obstetrics and Gynecology

## 2018-05-23 DIAGNOSIS — N6310 Unspecified lump in the right breast, unspecified quadrant: Secondary | ICD-10-CM | POA: Diagnosis not present

## 2018-05-23 DIAGNOSIS — N6311 Unspecified lump in the right breast, upper outer quadrant: Secondary | ICD-10-CM | POA: Diagnosis not present

## 2018-05-23 DIAGNOSIS — N6313 Unspecified lump in the right breast, lower outer quadrant: Secondary | ICD-10-CM | POA: Diagnosis not present

## 2018-06-27 DIAGNOSIS — Z809 Family history of malignant neoplasm, unspecified: Secondary | ICD-10-CM | POA: Diagnosis not present

## 2018-06-30 ENCOUNTER — Other Ambulatory Visit: Payer: Self-pay | Admitting: Obstetrics and Gynecology

## 2018-06-30 DIAGNOSIS — Z803 Family history of malignant neoplasm of breast: Secondary | ICD-10-CM

## 2018-07-24 IMAGING — CR DG SKULL COMPLETE 4+V
6 series · 6 of 6 positions shown · non-contrast
Comparison: None.

CLINICAL DATA: Knot on right forehead area.

EXAM:
SKULL - COMPLETE 4 + VIEW

[w skull a.p./p.a.]
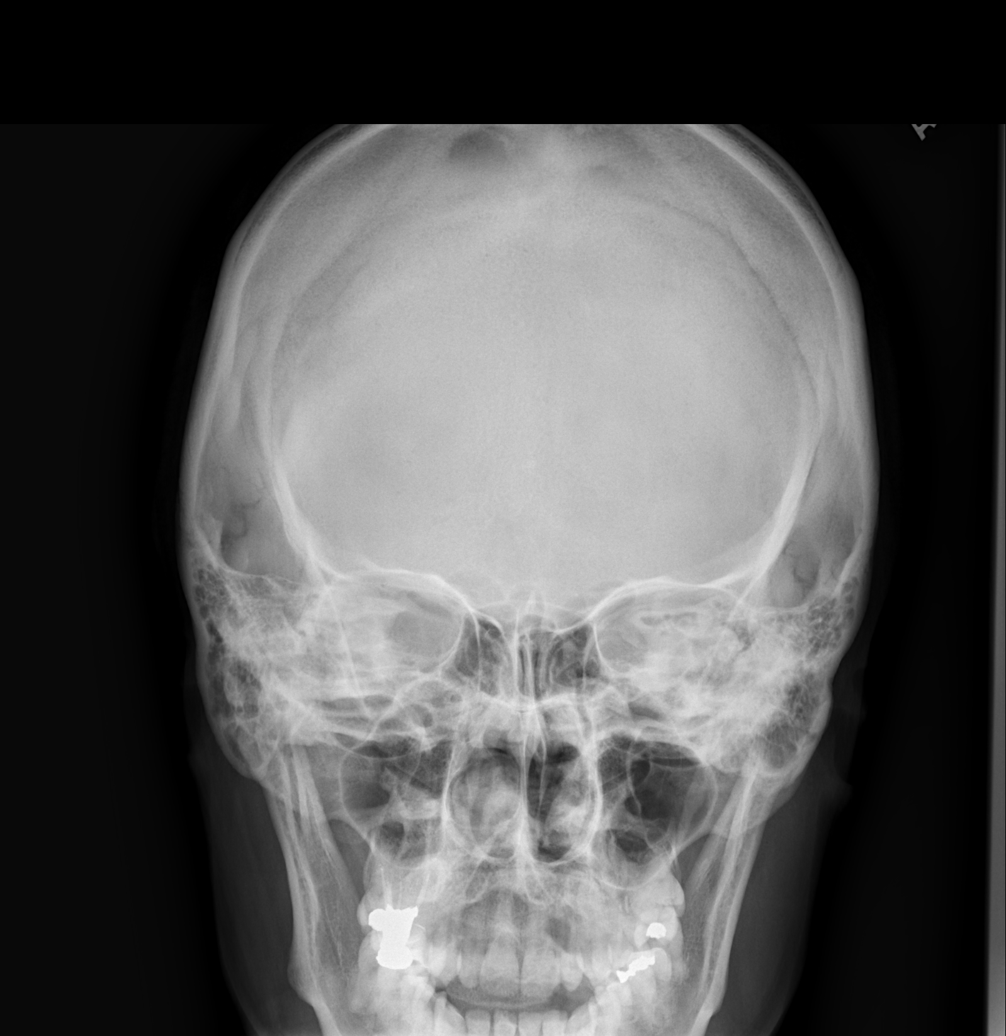

[[person_name] (1 of 2)]
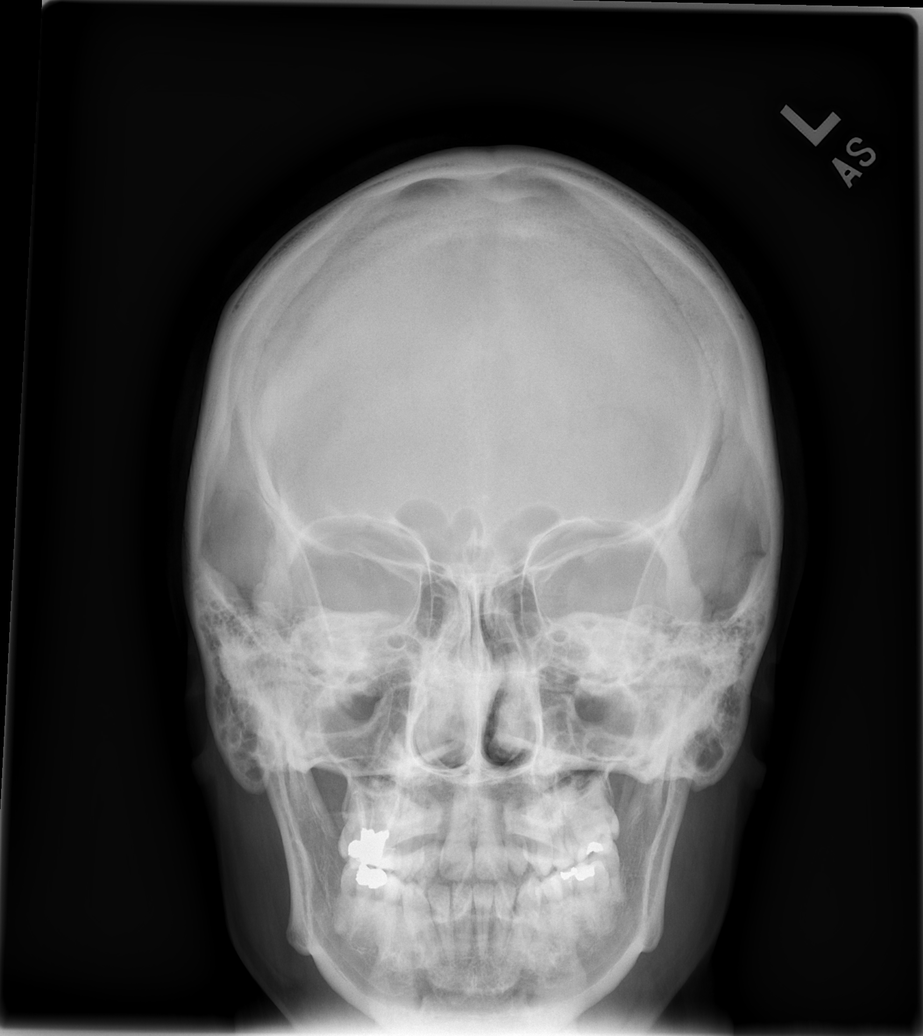

[w skull lat (1 of 3)]
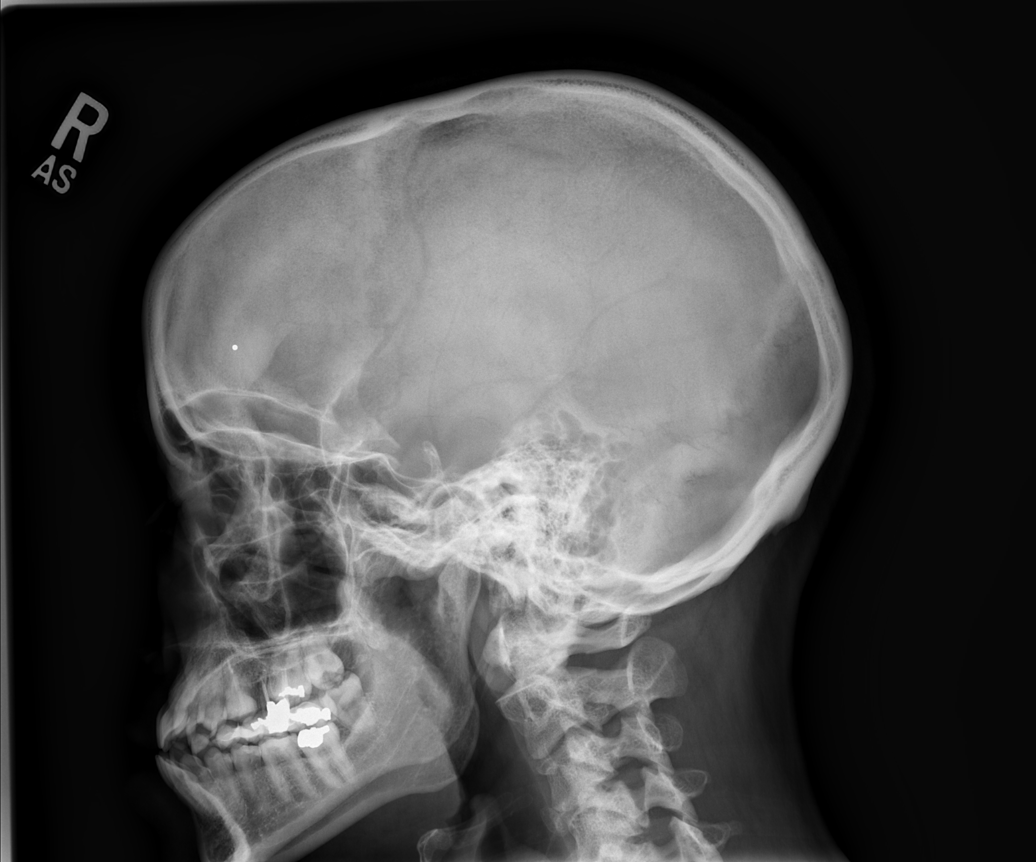

[w skull lat (2 of 3)]
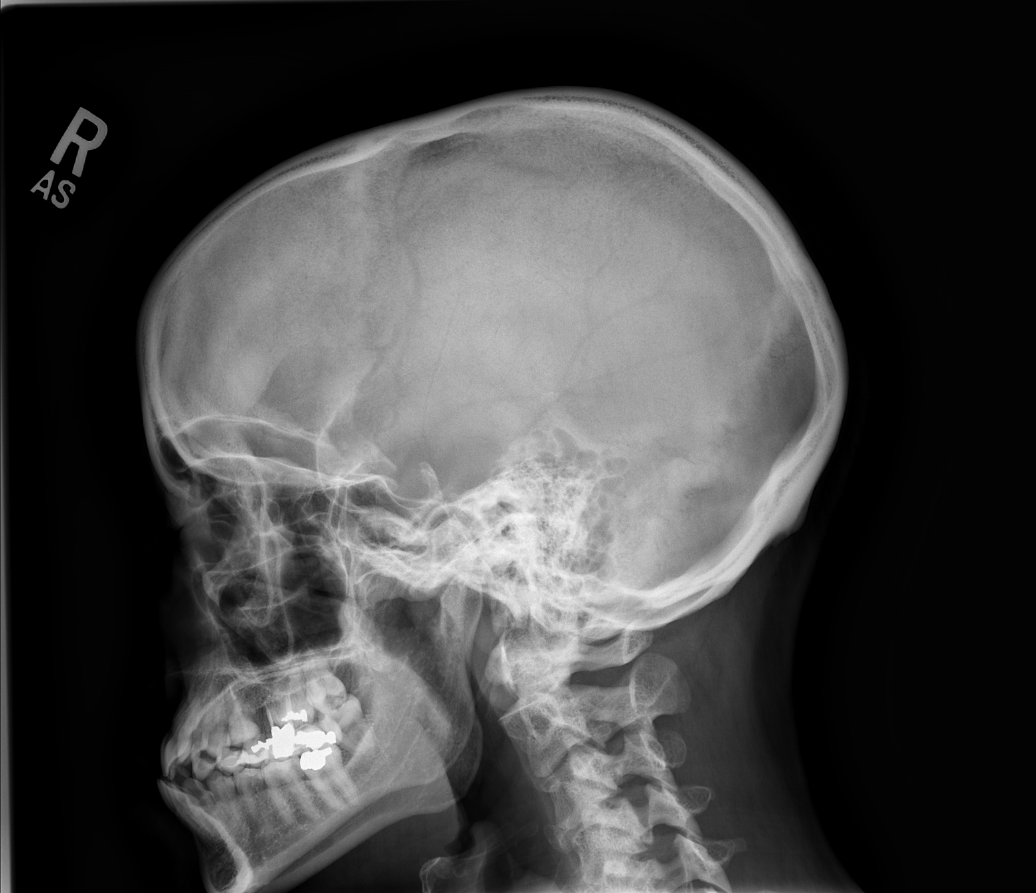

[w skull lat (3 of 3)]
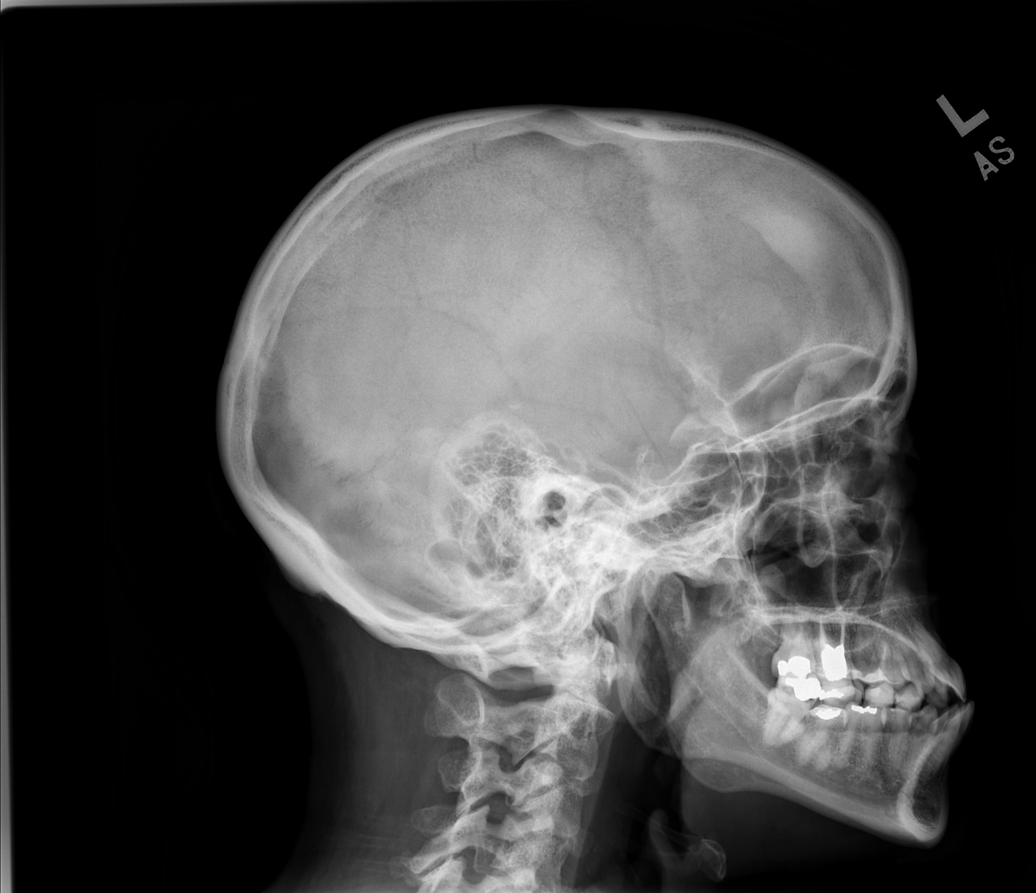

[[person_name] (2 of 2)]
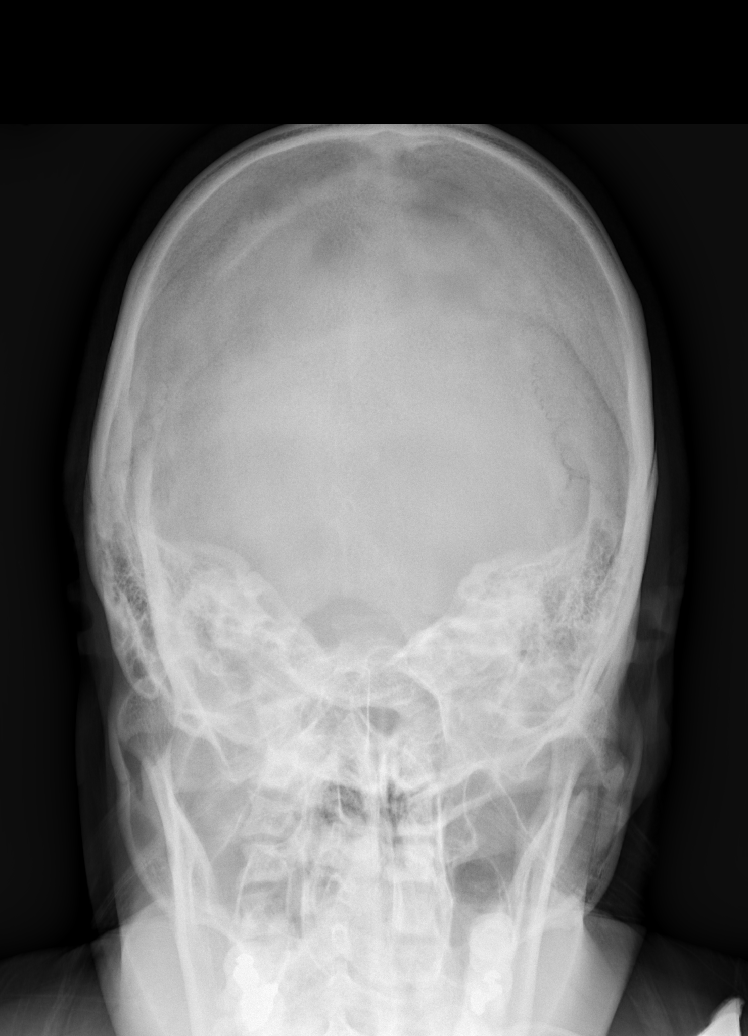

[6 of 6 positions shown; findings below may reference images not displayed]

FINDINGS: There is an area of slight increased density noted on the lateral
view in the area concern marked with a BB. This is not seen on other
images. No additional bony abnormality. No fracture.
IMPRESSION: Area of sclerosis noted in the area of concern marked with BB, only
seen on the right lateral view. This could be further evaluated with
head CT..

## 2018-10-07 ENCOUNTER — Other Ambulatory Visit: Payer: Self-pay | Admitting: Obstetrics and Gynecology

## 2018-10-13 ENCOUNTER — Ambulatory Visit
Admission: RE | Admit: 2018-10-13 | Discharge: 2018-10-13 | Disposition: A | Discharge status: Home / Self Care | Payer: BLUE CROSS/BLUE SHIELD | Source: Ambulatory Visit | Attending: Obstetrics and Gynecology | Admitting: Obstetrics and Gynecology

## 2018-10-13 DIAGNOSIS — Z803 Family history of malignant neoplasm of breast: Secondary | ICD-10-CM | POA: Diagnosis not present

## 2018-10-13 MED ORDER — GADOBUTROL 1 MMOL/ML IV SOLN
8.0000 mL | Freq: Once | INTRAVENOUS | Status: AC | PRN
  Administered 2018-10-13: 8 mL via INTRAVENOUS

## 2018-10-14 ENCOUNTER — Other Ambulatory Visit: Payer: Self-pay | Admitting: Obstetrics and Gynecology

## 2018-10-14 DIAGNOSIS — R9389 Abnormal findings on diagnostic imaging of other specified body structures: Secondary | ICD-10-CM

## 2018-10-16 DIAGNOSIS — I10 Essential (primary) hypertension: Secondary | ICD-10-CM | POA: Diagnosis not present

## 2018-10-16 DIAGNOSIS — R202 Paresthesia of skin: Secondary | ICD-10-CM | POA: Diagnosis not present

## 2018-10-16 DIAGNOSIS — R29818 Other symptoms and signs involving the nervous system: Secondary | ICD-10-CM | POA: Diagnosis not present

## 2018-10-20 ENCOUNTER — Ambulatory Visit
Admission: RE | Admit: 2018-10-20 | Discharge: 2018-10-20 | Disposition: A | Discharge status: Home / Self Care | Payer: BLUE CROSS/BLUE SHIELD | Source: Ambulatory Visit | Attending: Obstetrics and Gynecology | Admitting: Obstetrics and Gynecology

## 2018-10-20 ENCOUNTER — Other Ambulatory Visit: Payer: Self-pay | Admitting: Obstetrics and Gynecology

## 2018-10-20 DIAGNOSIS — R9389 Abnormal findings on diagnostic imaging of other specified body structures: Secondary | ICD-10-CM

## 2018-10-20 DIAGNOSIS — R928 Other abnormal and inconclusive findings on diagnostic imaging of breast: Secondary | ICD-10-CM | POA: Diagnosis not present

## 2018-10-20 DIAGNOSIS — N62 Hypertrophy of breast: Secondary | ICD-10-CM | POA: Diagnosis not present

## 2018-10-20 MED ORDER — GADOBUTROL 1 MMOL/ML IV SOLN
8.0000 mL | Freq: Once | INTRAVENOUS | Status: AC | PRN
  Administered 2018-10-20: 8 mL via INTRAVENOUS

## 2018-11-03 ENCOUNTER — Encounter: Payer: Self-pay | Admitting: Neurology

## 2019-01-01 DIAGNOSIS — H5789 Other specified disorders of eye and adnexa: Secondary | ICD-10-CM | POA: Diagnosis not present

## 2019-01-15 ENCOUNTER — Telehealth: Payer: BLUE CROSS/BLUE SHIELD | Admitting: Neurology

## 2019-03-06 ENCOUNTER — Other Ambulatory Visit: Payer: Self-pay

## 2019-03-06 ENCOUNTER — Encounter: Payer: Self-pay | Admitting: Neurology

## 2019-03-06 ENCOUNTER — Ambulatory Visit (INDEPENDENT_AMBULATORY_CARE_PROVIDER_SITE_OTHER): Payer: BC Managed Care – PPO | Admitting: Neurology

## 2019-03-06 ENCOUNTER — Other Ambulatory Visit (INDEPENDENT_AMBULATORY_CARE_PROVIDER_SITE_OTHER): Payer: BC Managed Care – PPO

## 2019-03-06 VITALS — BP 121/78 | HR 78 | Temp 98.6°F | Ht 66.5 in | Wt 174.0 lb

## 2019-03-06 DIAGNOSIS — Z820 Family history of epilepsy and other diseases of the nervous system: Secondary | ICD-10-CM | POA: Diagnosis not present

## 2019-03-06 DIAGNOSIS — R2689 Other abnormalities of gait and mobility: Secondary | ICD-10-CM | POA: Diagnosis not present

## 2019-03-06 DIAGNOSIS — M542 Cervicalgia: Secondary | ICD-10-CM | POA: Diagnosis not present

## 2019-03-06 DIAGNOSIS — R202 Paresthesia of skin: Secondary | ICD-10-CM | POA: Diagnosis not present

## 2019-03-06 DIAGNOSIS — R6889 Other general symptoms and signs: Secondary | ICD-10-CM

## 2019-03-06 DIAGNOSIS — R200 Anesthesia of skin: Secondary | ICD-10-CM

## 2019-03-06 LAB — VITAMIN B12: Vitamin B-12: 377 pg/mL (ref 200–1100)

## 2019-03-06 LAB — TSH: TSH: 1.25 mIU/L

## 2019-03-06 NOTE — Addendum Note (Signed)
Addended by: STONE-ELMORE, Charolett Yarrow I on: 03/06/2019 03:40 PM   Modules accepted: Orders  

## 2019-03-06 NOTE — Patient Instructions (Addendum)
Your neurologic exam is normal.  However, we should definitely perform testing to try and find an answer to your symptoms.  1.  MRI of brain and cervical spine with and without contrast 2.  Check B12 and TSH. 3.  Further recommendations pending results.  Your provider has requested that you have labwork completed today. Please go to [facility] ([id number]) on the second floor of this building before leaving the office today. You do not need to check in. If you are not called within 15 minutes please check with the front desk.   We have sent a referral to [facility] for your MRI's and they will call you directly to schedule your appointment. They are located at [place]. If you need to contact them directly please call [phone].

## 2019-03-06 NOTE — Addendum Note (Signed)
Addended by: Kaylyn Lim I on: 03/06/2019 03:40 PM   Modules accepted: Orders

## 2019-03-06 NOTE — Progress Notes (Addendum)
NEUROLOGY CONSULTATION NOTE  Angela Graham MRN: [id number] DOB: [birth year]  Referring provider: Laurann Montanaynthia White, MD Primary care provider: Laurann Montanaynthia White, MD  Reason for consult:  Paresthesias, balance problems; rule out multiple sclerosis  HISTORY OF PRESENT ILLNESS: Angela Graham is a 43 year old right-handed black woman who presents for above.  History supplemented by referring provider note.  Since last year, she feels off-balance when turning the corner.  No falls.  No associated dizziness.  Otherwise, balance is fine.  Over past 5 months, she occasionally she feels a cold sensation down her back. It often occurs with prolonged sitting.  Since a year ago, she sometimes feels a jolt up and down the cervical spine, not associated with any particular neck movement.  Sometimes when she wakes up in the morning, she notes numbness and tingling in her toes (primarily the big toe) but she attributes it to her L4-5 disc herniation.  Mild low back pain but no radicular pain down the legs.  No changes in vision.  No episodes of unilateral numbness or weakness.  No numbness or tingling down the arms.  No weakness of the arms or legs.    She is concerned that she may have multiple sclerosis because she has a first cousin on her father's side with multiple sclerosis.  She also has a first cousin on her mother's side with lupus.  10/16/18 LABS:  TSH 1.00; CMP with Na 138, K 4.2, Cl 103, CO2 29, glucose 87, BUN 11, Cr 0.78, t bili 0.4, ALP 56, AST 17, ALT 14; CBC with WBC 5.5, HGB 12.8, HCT 38.5, PLT 247  PAST MEDICAL HISTORY: Past Medical History:  Diagnosis Date  . Anemia    as a teenager  . HTN in pregnancy, chronic 01/29/2013   History - resolved after delivery  . Hypertension    no meds  . PONV (postoperative nausea and vomiting)   . Termination of pregnancy (fetus) 1996, 2003   x 2    PAST SURGICAL HISTORY: Past Surgical History:  Procedure Laterality Date  . CESAREAN SECTION N/A  02/09/2013   Procedure: Primary CESAREAN SECTION of baby girl at 0149 APGAR 7/9;  Surgeon: Purcell NailsAngela Y Roberts, MD;  Location: [facility];  Service: Obstetrics;  Laterality: N/A;  . CESAREAN SECTION WITH BILATERAL TUBAL LIGATION Bilateral 06/29/2014   Procedure: REPEAT CESAREAN SECTION WITH BILATERAL TUBAL LIGATION;  Surgeon: Turner Danielsavid C Lowe, MD;  Location: [facility];  Service: Obstetrics;  Laterality: Bilateral;  repeat edc 07/04/14  . DILATION AND CURETTAGE OF UTERUS     x 2 EAB  . WISDOM TOOTH EXTRACTION    . WISDOM TOOTH EXTRACTION      MEDICATIONS: Current Outpatient Medications on File Prior to Visit  Medication Sig Dispense Refill  . acetaminophen (TYLENOL) 500 MG tablet Take 1,000 mg by mouth every 6 (six) hours as needed.    . hydrochlorothiazide (HYDRODIURIL) 25 MG tablet Take 0.5 tablets (12.5 mg total) by mouth daily. 15 tablet 0  . Multiple Vitamin (MULTIVITAMIN) tablet Take 1 tablet by mouth daily.    . ondansetron (ZOFRAN ODT) 4 MG disintegrating tablet Take 1 tablet (4 mg total) by mouth every 8 (eight) hours as needed for nausea or vomiting. 20 tablet 0   No current facility-administered medications on file prior to visit.     ALLERGIES: No Known Allergies  FAMILY HISTORY: Family History  Problem Relation Age of Onset  . Depression Mother   . Hypertension Mother   . Cancer  Father [phone]       prostate  . Hypertension Brother   . Depression Maternal Grandmother   . Heart disease Maternal Grandmother        CHF  . Hypertension Maternal Grandmother   . Stroke Maternal Grandmother   . Cancer Maternal Grandfather        brain  . Cancer Paternal Grandfather        colon  . Hypertension Brother   . Cancer Paternal Uncle        prostate  . Breast cancer Neg Hx    SOCIAL HISTORY: Social History   Socioeconomic History  . Marital status: Married    Spouse name: [name]  . Number of children: 0  . Years of education: 7916  . Highest education level: Not on file   Occupational History  . Occupation: speech therapist  Social Needs  . Financial resource strain: Not on file  . Food insecurity    Worry: Not on file    Inability: Not on file  . Transportation needs    Medical: Not on file    Non-medical: Not on file  Tobacco Use  . Smoking status: Never Smoker  . Smokeless tobacco: Never Used  Substance and Sexual Activity  . Alcohol use: No    Comment: none since + UPT  . Drug use: No  . Sexual activity: Yes    Birth control/protection: None  Lifestyle  . Physical activity    Days per week: Not on file    Minutes per session: Not on file  . Stress: Not on file  Relationships  . Social Musicianconnections    Talks on phone: Not on file    Gets together: Not on file    Attends religious service: Not on file    Active member of club or organization: Not on file    Attends meetings of clubs or organizations: Not on file    Relationship status: Not on file  . Intimate partner violence    Fear of current or ex partner: Not on file    Emotionally abused: Not on file    Physically abused: Not on file    Forced sexual activity: Not on file  Other Topics Concern  . Not on file  Social History Narrative   ** Merged History Encounter **        REVIEW OF SYSTEMS: Constitutional: No fevers, chills, or sweats, no generalized fatigue, change in appetite Eyes: No visual changes, double vision, eye pain Ear, nose and throat: No hearing loss, ear pain, nasal congestion, sore throat Cardiovascular: No chest pain, palpitations Respiratory:  No shortness of breath at rest or with exertion, wheezes GastrointestinaI: No nausea, vomiting, diarrhea, abdominal pain, fecal incontinence Genitourinary:  No dysuria, urinary retention or frequency Musculoskeletal:  No neck pain, back pain Integumentary: No rash, pruritus, skin lesions Neurological: as above Psychiatric: No depression, insomnia, anxiety Endocrine: No palpitations, fatigue, diaphoresis, mood  swings, change in appetite, change in weight, increased thirst Hematologic/Lymphatic:  No purpura, petechiae. Allergic/Immunologic: no itchy/runny eyes, nasal congestion, recent allergic reactions, rashes  PHYSICAL EXAM: Blood pressure 121/78, pulse 78, temperature 98.6 F (37 C), temperature source Oral, height 5' 6.5" (1.689 m), weight 174 lb (78.9 kg), SpO2 98 %. General: No acute distress.  Patient appears well-groomed.   Head:  Normocephalic/atraumatic Eyes:  fundi examined but not visualized Neck: supple, no paraspinal tenderness, full range of motion Back: No paraspinal tenderness Heart: regular rate and rhythm Lungs: Clear to auscultation  bilaterally. Vascular: No carotid bruits. Neurological Exam: Mental status: alert and oriented to person, place, and time, recent and remote memory intact, fund of knowledge intact, attention and concentration intact, speech fluent and not dysarthric, language intact. Cranial nerves: CN I: not tested CN II: pupils equal, round and reactive to light, visual fields intact CN III, IV, VI:  full range of motion, no nystagmus, no ptosis CN V: facial sensation intact CN VII: upper and lower face symmetric CN VIII: hearing intact CN IX, X: gag intact, uvula midline CN XI: sternocleidomastoid and trapezius muscles intact CN XII: tongue midline Bulk & Tone: normal, no fasciculations. Motor:  5/5 throughout Sensation:  Pinprick and vibration sensation intact. Deep Tendon Reflexes:  2+ throughout, toes downgoing.   Finger to nose testing:  Without dysmetria.   Heel to shin:  Without dysmetria.   Gait:  Normal station and stride.  Able to turn and tandem walk. Romberg negative.  IMPRESSION: 1.  Balance problems.   2.  Neck and back pain.  No Lhermitte's sign reproduced on exam. 3.  Paresthesias of toes, primarily first digit, may correlate with L5 radiculopathy. 4.  Family history of MS  Symptoms are vague.  Overall balance is okay.  Neurologic  exam is normal.  However, given her age and family history, I think evaluation for possible MS is warranted.  PLAN: 1.  MRI of brain and cervical spine with and without contrast 2.  Check B12 and TSH to evaluate for cause of paresthesias and balance problems. 3.  Further recommendations pending results.  Thank you for allowing me to take part in the care of this patient.  Metta Clines, DO  CC: Harlan Stains, MD  04/10/19 ADDENDUM:  B12 and TSH from 03/06/19 were normal, 377 and 1.25 respectively.  MRI of brain and cervical spine with and without contrast from 04/09/19 personally reviewed and demonstrated unremarkable brain and cervical spinal cord without evidence of MS.  In light of her unremarkable neurologic exam, I do not suspect an underlying neurologic disease.  No further recommendations.

## 2019-03-09 ENCOUNTER — Telehealth: Payer: Self-pay

## 2019-03-09 NOTE — Telephone Encounter (Signed)
Called and LMOVM advising Pt labs are normal 

## 2019-03-09 NOTE — Telephone Encounter (Signed)
-----   Message from Pieter Partridge, DO sent at 03/08/2019  5:05 PM EDT ----- Labs are normal

## 2019-04-09 ENCOUNTER — Other Ambulatory Visit: Payer: Self-pay

## 2019-04-09 ENCOUNTER — Ambulatory Visit
Admission: RE | Admit: 2019-04-09 | Discharge: 2019-04-09 | Disposition: A | Discharge status: Home / Self Care | Payer: BC Managed Care – PPO | Source: Ambulatory Visit | Attending: Neurology | Admitting: Neurology

## 2019-04-09 DIAGNOSIS — R202 Paresthesia of skin: Secondary | ICD-10-CM

## 2019-04-09 DIAGNOSIS — R2689 Other abnormalities of gait and mobility: Secondary | ICD-10-CM

## 2019-04-09 DIAGNOSIS — Z820 Family history of epilepsy and other diseases of the nervous system: Secondary | ICD-10-CM

## 2019-04-09 DIAGNOSIS — R200 Anesthesia of skin: Secondary | ICD-10-CM

## 2019-04-09 DIAGNOSIS — R6889 Other general symptoms and signs: Secondary | ICD-10-CM

## 2019-04-09 DIAGNOSIS — M542 Cervicalgia: Secondary | ICD-10-CM

## 2019-04-09 DIAGNOSIS — M8938 Hypertrophy of bone, other site: Secondary | ICD-10-CM | POA: Diagnosis not present

## 2019-04-09 MED ORDER — GADOBENATE DIMEGLUMINE 529 MG/ML IV SOLN
16.0000 mL | Freq: Once | INTRAVENOUS | Status: AC | PRN
  Administered 2019-04-09: 16 mL via INTRAVENOUS

## 2019-04-13 ENCOUNTER — Telehealth: Payer: Self-pay

## 2019-04-13 NOTE — Telephone Encounter (Signed)
Called and advised Pt of results. 

## 2019-04-13 NOTE — Telephone Encounter (Signed)
-----   Message from Pieter Partridge, DO sent at 04/10/2019  7:37 AM EDT ----- Reviewed MRI.  Brain and spinal cord look unremarkable with no evidence of MS.  Her neurologic exam was unremarkable.  I don't suspect any underlying neurologic condition.  No further recommendations.

## 2019-05-05 DIAGNOSIS — Z20828 Contact with and (suspected) exposure to other viral communicable diseases: Secondary | ICD-10-CM | POA: Diagnosis not present

## 2019-05-09 DIAGNOSIS — Z20828 Contact with and (suspected) exposure to other viral communicable diseases: Secondary | ICD-10-CM | POA: Diagnosis not present

## 2019-05-14 DIAGNOSIS — Z20828 Contact with and (suspected) exposure to other viral communicable diseases: Secondary | ICD-10-CM | POA: Diagnosis not present

## 2019-05-15 ENCOUNTER — Telehealth: Payer: Self-pay | Admitting: *Deleted

## 2019-05-15 NOTE — Telephone Encounter (Signed)
Completed fax from [place] needing code for lab done on 03/06/19 - confirmation fax received back from [phone]

## 2020-10-28 ENCOUNTER — Other Ambulatory Visit: Payer: Self-pay | Admitting: Family Medicine

## 2020-10-31 ENCOUNTER — Other Ambulatory Visit: Payer: Self-pay | Admitting: Family Medicine

## 2020-10-31 DIAGNOSIS — R229 Localized swelling, mass and lump, unspecified: Secondary | ICD-10-CM

## 2020-11-07 ENCOUNTER — Ambulatory Visit
Admission: RE | Admit: 2020-11-07 | Discharge: 2020-11-07 | Disposition: A | Discharge status: Home / Self Care | Payer: BC Managed Care – PPO | Source: Ambulatory Visit | Attending: Family Medicine | Admitting: Family Medicine

## 2020-11-07 DIAGNOSIS — R229 Localized swelling, mass and lump, unspecified: Secondary | ICD-10-CM

## 2022-05-17 ENCOUNTER — Encounter: Payer: Self-pay | Admitting: *Deleted

## 2022-05-17 ENCOUNTER — Other Ambulatory Visit: Payer: Self-pay | Admitting: *Deleted

## 2022-05-17 ENCOUNTER — Ambulatory Visit: Payer: 59 | Attending: Physician Assistant

## 2022-05-17 DIAGNOSIS — R002 Palpitations: Secondary | ICD-10-CM

## 2022-05-17 NOTE — Progress Notes (Unsigned)
Enrolled for [employer] to mail a ZIO XT long term holter monitor to the patients address on file.   Letter with instructions mailed to patient.  DOD to read. 

## 2022-05-29 DIAGNOSIS — R002 Palpitations: Secondary | ICD-10-CM | POA: Diagnosis not present

## 2022-07-23 NOTE — Progress Notes (Unsigned)
Cardiology Office Note:    Date:  07/24/2022   ID:  Angela Graham, DOB 10-01-1975, MRN [id number]  PCP:  Laurann Montana, MD  Cardiologist:  None   Referring MD: Laurann Montana, MD   Chief Complaint  Patient presents with   Irregular Heart Beat   Hypertension    History of Present Illness:    Angela Graham is a 46 y.o. female with a hx of palpitations.  Has history of hypertension during pregnancy and also subsequently.  8 to 33-month history of occasional palpitations.  She complained to primary care about it and found that she was having on an extended monitor premature atrial complexes and 1 episode of accelerated junctional rhythm (rate approximately 80 to 90 bpm).  No atrial fibrillation.  No extremely rapid heart rates.  Feels well.  She is not sleeping well because her youngest child gets in the bed with them every night.   Past Medical History:  Diagnosis Date   Anemia    as a teenager   HTN in pregnancy, chronic 01/29/2013   History - resolved after delivery   Hypertension    no meds   PONV (postoperative nausea and vomiting)    Termination of pregnancy (fetus) 1996, 2003   x 2    Past Surgical History:  Procedure Laterality Date   CESAREAN SECTION N/A 02/09/2013   Procedure: Primary CESAREAN SECTION of baby girl at 0149 APGAR 7/9;  Surgeon: Purcell Nails, MD;  Location: [facility];  Service: Obstetrics;  Laterality: N/A;   CESAREAN SECTION WITH BILATERAL TUBAL LIGATION Bilateral 06/29/2014   Procedure: REPEAT CESAREAN SECTION WITH BILATERAL TUBAL LIGATION;  Surgeon: Turner Daniels, MD;  Location: [facility];  Service: Obstetrics;  Laterality: Bilateral;  repeat edc 07/04/14   DILATION AND CURETTAGE OF UTERUS     x 2 EAB   WISDOM TOOTH EXTRACTION     WISDOM TOOTH EXTRACTION      Current Medications: Current Meds  Medication Sig   amLODipine (NORVASC) 2.5 MG tablet Take 2.5 mg by mouth daily.   hydrochlorothiazide (HYDRODIURIL) 25 MG tablet Take 0.5 tablets (12.5  mg total) by mouth daily.   Multiple Vitamin (MULTIVITAMIN) tablet Take 1 tablet by mouth daily.     Allergies:   Patient has no known allergies.   Social History   Socioeconomic History   Marital status: Married    Spouse name: Aletta Edmunds   Number of children: 2   Years of education: 16   Highest education level: Master's degree (e.g., MA, MS, MEng, MEd, MSW, MBA)  Occupational History   Occupation: speech therapist  Tobacco Use   Smoking status: Never   Smokeless tobacco: Never  Substance and Sexual Activity   Alcohol use: No    Comment: none since + UPT   Drug use: No   Sexual activity: Yes    Birth control/protection: None  Other Topics Concern   Not on file  Social History Narrative   ** Merged History Encounter **      Patient is right-handed. She lives with her husband and 2 children in a one level home. She drinks 1 cup of coffee a day, 5 days a week. She walks 1-2 miles a day, 5 x a week.       Social Determinants of Health   Financial Resource Strain: Not on file  Food Insecurity: Not on file  Transportation Needs: Not on file  Physical Activity: Not on file  Stress: Not on file  Social Connections: Not on file     Family History: The patient's family history includes Cancer in her maternal grandfather, paternal grandfather, and paternal uncle; Cancer (age of onset: 2) in her father; Depression in her maternal grandmother and mother; Heart disease in her maternal grandmother; Hypertension in her brother, brother, maternal grandmother, and mother; Stroke in her maternal grandmother. There is no history of Breast cancer.  ROS:   Please see the history of present illness.    No chest pain, dyspnea, syncope, near syncope.  All other systems reviewed and are negative.  EKGs/Labs/Other Studies Reviewed:    The following studies were reviewed today: Long Term Monitor 04/2022: Study Highlights    Patch Wear Time:  14 days and 0 hours  (2023-10-03T07:29:24-0400 to 2023-10-17T07:29:28-0400)   Sinus rhythm. (Minimum HR of 51 bpm, maximum HR of 159 bpm, and avg HR of 84 bpm).  Junctional Rhythm was present. Junctional Rhythm was detected within +/- 45 seconds of symptomatic patient event(s).  Rare premature atrial contractions (<1.0%) associated with symptoms. Rare Premature ventricular contractions (<1.0%)  EKG:  EKG normal sinus rhythm, poor R wave progression, decreased voltage.  Recent Labs: No results found for requested labs within last 365 days.  Recent Lipid Panel No results found for: "CHOL", "TRIG", "HDL", "CHOLHDL", "VLDL", "LDLCALC", "LDLDIRECT"  Physical Exam:    VS:  BP (!) 142/102   Pulse 78   Ht 5' 6.5" (1.689 m)   Wt 173 lb 3.2 oz (78.6 kg)   SpO2 98%   BMI 27.54 kg/m     Wt Readings from Last 3 Encounters:  07/24/22 173 lb 3.2 oz (78.6 kg)  03/06/19 174 lb (78.9 kg)  02/08/17 160 lb (72.6 kg)     GEN: BMI 27. No acute distress HEENT: Normal NECK: No JVD. LYMPHATICS: No lymphadenopathy CARDIAC: No murmur. RRR no rub, gallop, or edema. VASCULAR:  Normal Pulses. No bruits. RESPIRATORY:  Clear to auscultation without rales, wheezing or rhonchi  ABDOMEN: Soft, non-tender, non-distended, No pulsatile mass, MUSCULOSKELETAL: No deformity  SKIN: Warm and dry NEUROLOGIC:  Alert and oriented x 3 PSYCHIATRIC:  Normal affect   ASSESSMENT:    1. Palpitations   2. Primary hypertension   3. Premature atrial contractions   4. Junctional rhythm   5. Atrial arrhythmia    PLAN:    In order of problems listed above:  Associated with isolated PAC's and 1 episode of self terminating accelerated junctional rhythm with heart rate in the 80-90 range.  No atrial fibrillation or ventricular ectopy of significance. Blood pressure is elevated.  She feels somewhat apprehensive.  She has taken hydrochlorothiazide and amlodipine today.  We discussed healthy blood pressure at her age.  We also discussed diet  and exercise.  She has a blood pressure monitor at home, which I encouraged her to use 2-3 times per month to ensure that her blood pressures are within range which is less than 140/90 mmHg and ideally less than 130/80 mmHg.  2D Doppler echocardiogram is ordered to rule out structural abnormalities with the heart including left ventricular hypertrophy or atrial chamber enlargement which could be stimulating the arrhythmia.   Medication Adjustments/Labs and Tests Ordered: Current medicines are reviewed at length with the patient today.  Concerns regarding medicines are outlined above.  Orders Placed This Encounter  Procedures   EKG 12-Lead   ECHOCARDIOGRAM COMPLETE   No orders of the defined types were placed in this encounter.   Patient Instructions  Medication Instructions:  Your physician  recommends that you continue on your current medications as directed. Please refer to the Current Medication list given to you today.  *If you need a refill on your cardiac medications before your next appointment, please call your pharmacy*  Testing/Procedures: Your physician has requested that you have an echocardiogram. Echocardiography is a painless test that uses sound waves to create images of your heart. It provides your doctor with information about the size and shape of your heart and how well your heart's chambers and valves are working. This procedure takes approximately one hour. There are no restrictions for this procedure. Please do NOT wear cologne, perfume, aftershave, or lotions (deodorant is allowed). Please arrive 15 minutes prior to your appointment time.  Follow-Up: As needed  Important Information About Sugar         Signed, Lesleigh Noe, MD  07/24/2022 2:52 PM    [facility]

## 2022-07-24 ENCOUNTER — Encounter: Payer: Self-pay | Admitting: Interventional Cardiology

## 2022-07-24 ENCOUNTER — Ambulatory Visit: Payer: 59 | Attending: Interventional Cardiology | Admitting: Interventional Cardiology

## 2022-07-24 VITALS — BP 142/102 | HR 78 | Ht 66.5 in | Wt 173.2 lb

## 2022-07-24 DIAGNOSIS — I491 Atrial premature depolarization: Secondary | ICD-10-CM

## 2022-07-24 DIAGNOSIS — R002 Palpitations: Secondary | ICD-10-CM

## 2022-07-24 DIAGNOSIS — I498 Other specified cardiac arrhythmias: Secondary | ICD-10-CM

## 2022-07-24 DIAGNOSIS — I10 Essential (primary) hypertension: Secondary | ICD-10-CM

## 2022-07-24 NOTE — Patient Instructions (Signed)
Medication Instructions:  Your physician recommends that you continue on your current medications as directed. Please refer to the Current Medication list given to you today.  *If you need a refill on your cardiac medications before your next appointment, please call your pharmacy*  Testing/Procedures: Your physician has requested that you have an echocardiogram. Echocardiography is a painless test that uses sound waves to create images of your heart. It provides your doctor with information about the size and shape of your heart and how well your heart's chambers and valves are working. This procedure takes approximately one hour. There are no restrictions for this procedure. Please do NOT wear cologne, perfume, aftershave, or lotions (deodorant is allowed). Please arrive 15 minutes prior to your appointment time.  Follow-Up: As needed  Important Information About Sugar

## 2022-08-14 ENCOUNTER — Ambulatory Visit (HOSPITAL_COMMUNITY): Payer: 59 | Attending: Interventional Cardiology

## 2022-08-14 DIAGNOSIS — R002 Palpitations: Secondary | ICD-10-CM | POA: Insufficient documentation

## 2022-08-14 DIAGNOSIS — I498 Other specified cardiac arrhythmias: Secondary | ICD-10-CM | POA: Diagnosis not present

## 2022-08-14 LAB — ECHOCARDIOGRAM COMPLETE
Area-P 1/2: 4.24 cm2
S' Lateral: 2.9 cm

## 2022-08-31 ENCOUNTER — Other Ambulatory Visit (HOSPITAL_COMMUNITY): Payer: 59

## 2022-09-13 ENCOUNTER — Other Ambulatory Visit: Payer: Self-pay | Admitting: Obstetrics and Gynecology

## 2022-09-13 DIAGNOSIS — R928 Other abnormal and inconclusive findings on diagnostic imaging of breast: Secondary | ICD-10-CM

## 2022-09-20 ENCOUNTER — Ambulatory Visit
Admission: RE | Admit: 2022-09-20 | Discharge: 2022-09-20 | Disposition: A | Discharge status: Home / Self Care | Payer: 59 | Source: Ambulatory Visit | Attending: Obstetrics and Gynecology | Admitting: Obstetrics and Gynecology

## 2022-09-20 ENCOUNTER — Other Ambulatory Visit: Payer: Self-pay | Admitting: Obstetrics and Gynecology

## 2022-09-20 DIAGNOSIS — N6310 Unspecified lump in the right breast, unspecified quadrant: Secondary | ICD-10-CM

## 2022-09-20 DIAGNOSIS — R928 Other abnormal and inconclusive findings on diagnostic imaging of breast: Secondary | ICD-10-CM

## 2022-10-30 ENCOUNTER — Telehealth: Payer: Self-pay

## 2022-10-30 NOTE — Telephone Encounter (Signed)
   Pre-operative Risk Assessment    Patient Name: Angela Graham  DOB: 1976/07/12 MRN: [id number]     Request for Surgical Clearance    Procedure:   Hyst Dtc. Poss myosure, poss novasure  Date of Surgery:  Clearance 01/08/23                                 Surgeon:  Dr. Louretta Shorten Surgeon's Group or Practice Name:  [facility]  Phone number:  [phone] Fax number:  [phone]   Type of Clearance Requested:   - Medical    Type of Anesthesia:  Mobile anesthesia    Additional requests/questions:    SignedMendel Ryder   10/30/2022, 2:19 PM

## 2022-10-30 NOTE — Telephone Encounter (Signed)
   Name: Angela Graham  DOB: September 03, 1975  MRN: [id number]  Primary Cardiologist: None   Preoperative team, please contact this patient and set up a phone call appointment for further preoperative risk assessment. Please obtain consent and complete medication review. Thank you for your help.  I confirm that guidance regarding antiplatelet and oral anticoagulation therapy has been completed and, if necessary, noted below.  None requested.   Deberah Pelton, NP 10/30/2022, 3:29 PM [place]

## 2022-10-30 NOTE — Telephone Encounter (Signed)
Patient scheduled for telehealth visit on 4/29 for preop clearance. Med rec and consent done

## 2022-10-30 NOTE — Telephone Encounter (Signed)
  Patient Consent for Virtual Visit         Angela Graham has provided verbal consent on 10/30/2022 for a virtual visit (video or telephone).   CONSENT FOR VIRTUAL VISIT FOR:  Angela Graham  By participating in this virtual visit I agree to the following:  I hereby voluntarily request, consent and authorize [facility] and its employed or contracted physicians, physician assistants, nurse practitioners or other licensed health care professionals (the Practitioner), to provide me with telemedicine health care services (the "Services") as deemed necessary by the treating Practitioner. I acknowledge and consent to receive the Services by the Practitioner via telemedicine. I understand that the telemedicine visit will involve communicating with the Practitioner through live audiovisual communication technology and the disclosure of certain medical information by electronic transmission. I acknowledge that I have been given the opportunity to request an in-person assessment or other available alternative prior to the telemedicine visit and am voluntarily participating in the telemedicine visit.  I understand that I have the right to withhold or withdraw my consent to the use of telemedicine in the course of my care at any time, without affecting my right to future care or treatment, and that the Practitioner or I may terminate the telemedicine visit at any time. I understand that I have the right to inspect all information obtained and/or recorded in the course of the telemedicine visit and may receive copies of available information for a reasonable fee.  I understand that some of the potential risks of receiving the Services via telemedicine include:  Delay or interruption in medical evaluation due to technological equipment failure or disruption; Information transmitted may not be sufficient (e.g. poor resolution of images) to allow for appropriate medical decision making by the  Practitioner; and/or  In rare instances, security protocols could fail, causing a breach of personal health information.  Furthermore, I acknowledge that it is my responsibility to provide information about my medical history, conditions and care that is complete and accurate to the best of my ability. I acknowledge that Practitioner's advice, recommendations, and/or decision may be based on factors not within their control, such as incomplete or inaccurate data provided by me or distortions of diagnostic images or specimens that may result from electronic transmissions. I understand that the practice of medicine is not an exact science and that Practitioner makes no warranties or guarantees regarding treatment outcomes. I acknowledge that a copy of this consent can be made available to me via my patient portal ([place]), or I can request a printed copy by calling the office of [place].    I understand that my insurance will be billed for this visit.   I have read or had this consent read to me. I understand the contents of this consent, which adequately explains the benefits and risks of the Services being provided via telemedicine.  I have been provided ample opportunity to ask questions regarding this consent and the Services and have had my questions answered to my satisfaction. I give my informed consent for the services to be provided through the use of telemedicine in my medical care

## 2022-11-30 ENCOUNTER — Other Ambulatory Visit: Payer: Self-pay

## 2022-11-30 ENCOUNTER — Emergency Department (HOSPITAL_BASED_OUTPATIENT_CLINIC_OR_DEPARTMENT_OTHER)
Admission: EM | Admit: 2022-11-30 | Discharge: 2022-12-01 | Disposition: A | Discharge status: Home / Self Care | Payer: 59 | Attending: Emergency Medicine | Admitting: Emergency Medicine

## 2022-11-30 DIAGNOSIS — Z79899 Other long term (current) drug therapy: Secondary | ICD-10-CM | POA: Insufficient documentation

## 2022-11-30 DIAGNOSIS — N939 Abnormal uterine and vaginal bleeding, unspecified: Secondary | ICD-10-CM | POA: Insufficient documentation

## 2022-11-30 DIAGNOSIS — N83202 Unspecified ovarian cyst, left side: Secondary | ICD-10-CM

## 2022-11-30 NOTE — ED Triage Notes (Signed)
Pt. In POV, c/o vaginal bleeding w/ many clots. OB told her she has uterine polyps. C/o  right side pain, 3/10

## 2022-12-01 ENCOUNTER — Emergency Department (HOSPITAL_COMMUNITY): Payer: 59

## 2022-12-01 LAB — BASIC METABOLIC PANEL
Anion gap: 6 (ref 5–15)
BUN: 17 mg/dL (ref 6–20)
CO2: 27 mmol/L (ref 22–32)
Calcium: 9.3 mg/dL (ref 8.9–10.3)
Chloride: 104 mmol/L (ref 98–111)
Creatinine, Ser: 0.89 mg/dL (ref 0.44–1.00)
GFR, Estimated: >60 mL/min (ref >60)
Glucose, Bld: 103 mg/dL — ABNORMAL HIGH (ref 70–99)
Potassium: 3.8 mmol/L (ref 3.5–5.1)
Sodium: 137 mmol/L (ref 135–145)

## 2022-12-01 LAB — CBC WITH DIFFERENTIAL/PLATELET
Abs Immature Granulocytes: 0.02 10*3/uL (ref 0.00–0.07)
Basophils Absolute: 0.1 10*3/uL (ref 0.0–0.1)
Basophils Relative: 1 %
Eosinophils Absolute: 0.2 10*3/uL (ref 0.0–0.5)
Eosinophils Relative: 2 %
HCT: 38.3 % (ref 36.0–46.0)
Hemoglobin: 12.7 g/dL (ref 12.0–15.0)
Immature Granulocytes: 0 %
Lymphocytes Relative: 29 %
Lymphs Abs: 2.3 10*3/uL (ref 0.7–4.0)
MCH: 30.5 pg (ref 26.0–34.0)
MCHC: 33.2 g/dL (ref 30.0–36.0)
MCV: 92.1 fL (ref 80.0–100.0)
Monocytes Absolute: 0.6 10*3/uL (ref 0.1–1.0)
Monocytes Relative: 8 %
Neutro Abs: 4.8 10*3/uL (ref 1.7–7.7)
Neutrophils Relative %: 60 %
Platelets: 256 10*3/uL (ref 150–400)
RBC: 4.16 MIL/uL (ref 3.87–5.11)
RDW: 13 % (ref 11.5–15.5)
WBC: 8 10*3/uL (ref 4.0–10.5)
nRBC: 0 % (ref 0.0–0.2)

## 2022-12-01 LAB — URINALYSIS, ROUTINE W REFLEX MICROSCOPIC
Bacteria, UA: NONE SEEN
Bilirubin Urine: NEGATIVE
Glucose, UA: NEGATIVE mg/dL
Ketones, ur: NEGATIVE mg/dL
Leukocytes,Ua: NEGATIVE
Nitrite: NEGATIVE
Protein, ur: NEGATIVE mg/dL
RBC / HPF: >50 RBC/hpf (ref 0–5)
Specific Gravity, Urine: 1.019 (ref 1.005–1.030)
pH: 6.5 (ref 5.0–8.0)

## 2022-12-01 LAB — PREGNANCY, URINE: Preg Test, Ur: NEGATIVE

## 2022-12-01 NOTE — ED Provider Notes (Signed)
Patient transferred from [facility] for pelvic US due to abnormal uterine bleeding.  US shows good placement of IUD.  Left ovarian cyst.  No fibroids.    Labs are reassuring.  Vitals are reassuring.  Patient says she has been passing some blood clots.  States that the blood is NOT pouring out.  Denies SOB, lightheadedness, dizziness.  Not hypotensive.   She is well appearing and in no acute distress.  Patient declined pelvic exam.  Is going to follow-up with OBGYN on Monday.  Appears stable for discharge.  Return precautions given.   Roxy Horseman, PA-C 12/01/22 0411    Sloan Leiter, DO 12/01/22 [phone]

## 2022-12-01 NOTE — ED Provider Notes (Signed)
[name] EMERGENCY DEPARTMENT AT [facility] Provider Note   CSN: [id number] Arrival date & time: 11/30/22  2330     History  Chief Complaint  Patient presents with   Vaginal Bleeding    Angela Graham is a 47 y.o. female.  Patient is a 47 year old female with past medical history of uterine polyp, tubal ligation, and Mirena ring.  Patient presenting with complaints of vaginal bleeding.  This started earlier today in the absence of any injury or trauma.  She denies abdominal pain, but does describe an abnormal full sensation to her suprapubic region.  She denies fevers or chills.  She denies the possibility of pregnancy.  Patient tells me she was told by her OB/GYN to come to the ER for an ultrasound to rule out "rupture of her uterine polyp".  The history is provided by the patient.       Home Medications Prior to Admission medications   Medication Sig Start Date End Date Taking? Authorizing Provider  amLODipine (NORVASC) 5 MG tablet Take 1 tablet by mouth daily. 10/04/21   [provider]  Multiple Vitamin (MULTIVITAMIN) tablet Take 1 tablet by mouth daily.    [provider]      Allergies    Patient has no known allergies.    Review of Systems   Review of Systems  All other systems reviewed and are negative.   Physical Exam Updated Vital Signs BP (!) 146/107   Pulse 66   Temp 98.3 F (36.8 C) (Oral)   Resp 15   LMP 11/30/2022 Comment: has merena in  SpO2 100%  Physical Exam Vitals and nursing note reviewed.  Constitutional:      General: She is not in acute distress.    Appearance: She is well-developed. She is not diaphoretic.  HENT:     Head: Normocephalic and atraumatic.  Cardiovascular:     Rate and Rhythm: Normal rate and regular rhythm.     Heart sounds: No murmur heard.    No friction rub. No gallop.  Pulmonary:     Effort: Pulmonary effort is normal. No respiratory distress.     Breath sounds: Normal breath sounds.  No wheezing.  Abdominal:     General: Bowel sounds are normal. There is no distension.     Palpations: Abdomen is soft.     Tenderness: There is no abdominal tenderness.  Musculoskeletal:        General: Normal range of motion.     Cervical back: Normal range of motion and neck supple.  Skin:    General: Skin is warm and dry.  Neurological:     General: No focal deficit present.     Mental Status: She is alert and oriented to person, place, and time.     ED Results / Procedures / Treatments   Labs (all labs ordered are listed, but only abnormal results are displayed) Labs Reviewed  BASIC METABOLIC PANEL - Abnormal; Notable for the following components:      Result Value   Glucose, Bld 103 (*)    All other components within normal limits  URINALYSIS, ROUTINE W REFLEX MICROSCOPIC - Abnormal; Notable for the following components:   Hgb urine dipstick LARGE (*)    All other components within normal limits  CBC WITH DIFFERENTIAL/PLATELET  PREGNANCY, URINE    EKG None  Radiology No results found.  Procedures Procedures    Medications Ordered in ED Medications - No data to display  ED Course/  Medical Decision Making/ A&P  Patient presenting with vaginal bleeding as described in the HPI.  She arrives here with stable vital signs and is afebrile.  Abdominal exam is benign revealing only mild suprapubic tenderness.  Laboratory studies have been obtained including CBC and basic metabolic panel.  Her hemoglobin is 12.7 and studies otherwise unremarkable.  Urinalysis shows no evidence for infection and pregnancy test is negative.  My plan was to obtain an ultrasound, however there was no ultrasound coverage at [facility] this evening.  Patient did not appear to be actively hemorrhaging and I had initially plan to obtain an ultrasound in the morning.  Patient expressed concern over waiting until morning for this study and was insistent upon having it performed this evening.   Patient will be transferred to [facility] for ultrasound to further evaluate the cause of her bleeding.  I have spoken with Dr. Wallace Cullens who accepts in transfer.  Final Clinical Impression(s) / ED Diagnoses Final diagnoses:  Vaginal bleeding    Rx / DC Orders ED Discharge Orders          Ordered    US Transvaginal Non-OB        12/01/22 0130              Geoffery Lyons, MD 12/01/22 [phone]

## 2022-12-01 NOTE — ED Notes (Signed)
Report given to [facility] ED charge nurse, Torrie Mayers. Pt. Verbalized understanding of risks and benefits of transfering via POV and signed transfer consent.

## 2022-12-23 NOTE — Progress Notes (Unsigned)
Virtual Visit via Telephone Note   Because of Angela Graham's co-morbid illnesses, she is at least at moderate risk for complications without adequate follow up.  This format is felt to be most appropriate for this patient at this time.  The patient did not have access to video technology/had technical difficulties with video requiring transitioning to audio format only (telephone).  All issues noted in this document were discussed and addressed.  No physical exam could be performed with this format.  Please refer to the patient's chart for her consent to telehealth for [facility].  Evaluation Performed:  Preoperative cardiovascular risk assessment _____________   Date:  12/23/2022   Patient ID:  Angela Graham, DOB 08/25/1976, MRN [id number] Patient Location:  Home Provider location:   Office  Primary Care Provider:  Laurann Montana, MD Primary Cardiologist:  None  Chief Complaint / Patient Profile   47 y.o. y/o female with a h/o HTN, palpitations, anemia who is pending hysterectomy and presents today for telephonic preoperative cardiovascular risk assessment.  History of Present Illness    Angela Graham is a 47 y.o. female who presents via audio/video conferencing for a telehealth visit today.  Pt was last seen in cardiology clinic on 07/24/2022 by Dr. Katrinka Blazing.  At that time Angela Graham was doing well but blood pressure was elevated and was experiencing palpitations she underwent a 2D echo that was normal. The patient is now pending procedure as outlined above. Since her last visit, she reports not experiencing any new cardiac complaints.  She does report still feeling palpitations that occur without any pattern and are not debilitating.  Her blood pressures remain elevated and are in the 130s over 90s.  I advised her to contact our office to be seen to discuss her palpitations and managing her blood pressure.  -Was seen on 11/30/2022 for bleeding and underwent  ultrasound to rule out rupture that was negative  She denies chest pain, shortness of breath, lower extremity edema, fatigue,  melena, hematuria, hemoptysis, diaphoresis, weakness, presyncope, syncope, orthopnea, and PND.  Past Medical History    Past Medical History:  Diagnosis Date   Anemia    as a teenager   HTN in pregnancy, chronic 01/29/2013   History - resolved after delivery   Hypertension    no meds   PONV (postoperative nausea and vomiting)    Termination of pregnancy (fetus) 1996, 2003   x 2   Past Surgical History:  Procedure Laterality Date   CESAREAN SECTION N/A 02/09/2013   Procedure: Primary CESAREAN SECTION of baby girl at 0149 APGAR 7/9;  Surgeon: Purcell Nails, MD;  Location: [facility];  Service: Obstetrics;  Laterality: N/A;   CESAREAN SECTION WITH BILATERAL TUBAL LIGATION Bilateral 06/29/2014   Procedure: REPEAT CESAREAN SECTION WITH BILATERAL TUBAL LIGATION;  Surgeon: Turner Daniels, MD;  Location: [facility];  Service: Obstetrics;  Laterality: Bilateral;  repeat edc 07/04/14   DILATION AND CURETTAGE OF UTERUS     x 2 EAB   WISDOM TOOTH EXTRACTION     WISDOM TOOTH EXTRACTION      Allergies  No Known Allergies  Home Medications    Prior to Admission medications   Medication Sig Start Date End Date Taking? Authorizing Provider  amLODipine (NORVASC) 5 MG tablet Take 1 tablet by mouth daily. 10/04/21   [provider]  Multiple Vitamin (MULTIVITAMIN) tablet Take 1 tablet by mouth daily.    [provider]    Physical  Exam    Vital Signs:  Angela Graham does not have vital signs available for review today.  Given telephonic nature of communication, physical exam is limited. AAOx3. NAD. Normal affect.  Speech and respirations are unlabored.  Accessory Clinical Findings    None  Assessment & Plan    1.  Preoperative Cardiovascular Risk Assessment:  -Patient's RCRI score is 0.4%  The patient affirms she has been doing well without any  new cardiac symptoms. They are able to achieve 5 METS without cardiac limitations. Therefore, based on ACC/AHA guidelines, the patient would be at acceptable risk for the planned procedure without further cardiovascular testing. The patient was advised that if she develops new symptoms prior to surgery to contact our office to arrange for a follow-up visit, and she verbalized understanding.   The patient was advised that if she develops new symptoms prior to surgery to contact our office to arrange for a follow-up visit, and she verbalized understanding.  No medications requested to be held  A copy of this note will be routed to requesting surgeon.  Time:   Today, I have spent 7 minutes with the patient with telehealth technology discussing medical history, symptoms, and management plan.     Napoleon Form, Leodis Rains, NP  12/23/2022, 6:40 PM

## 2022-12-24 ENCOUNTER — Ambulatory Visit: Payer: 59 | Attending: Cardiovascular Disease

## 2022-12-24 DIAGNOSIS — Z01810 Encounter for preprocedural cardiovascular examination: Secondary | ICD-10-CM

## 2023-03-28 ENCOUNTER — Ambulatory Visit
Admission: RE | Admit: 2023-03-28 | Discharge: 2023-03-28 | Disposition: A | Discharge status: Home / Self Care | Payer: 59 | Source: Ambulatory Visit | Attending: Obstetrics and Gynecology | Admitting: Obstetrics and Gynecology

## 2023-03-28 ENCOUNTER — Other Ambulatory Visit: Payer: Self-pay | Admitting: Obstetrics and Gynecology

## 2023-03-28 DIAGNOSIS — N6310 Unspecified lump in the right breast, unspecified quadrant: Secondary | ICD-10-CM

## 2023-04-15 ENCOUNTER — Ambulatory Visit (INDEPENDENT_AMBULATORY_CARE_PROVIDER_SITE_OTHER): Payer: 59 | Admitting: Podiatry

## 2023-04-15 ENCOUNTER — Encounter: Payer: Self-pay | Admitting: Podiatry

## 2023-04-15 ENCOUNTER — Ambulatory Visit (INDEPENDENT_AMBULATORY_CARE_PROVIDER_SITE_OTHER): Payer: 59

## 2023-04-15 DIAGNOSIS — M21612 Bunion of left foot: Secondary | ICD-10-CM | POA: Diagnosis not present

## 2023-04-15 DIAGNOSIS — M2012 Hallux valgus (acquired), left foot: Secondary | ICD-10-CM

## 2023-04-15 DIAGNOSIS — M2011 Hallux valgus (acquired), right foot: Secondary | ICD-10-CM

## 2023-04-15 DIAGNOSIS — M21611 Bunion of right foot: Secondary | ICD-10-CM

## 2023-04-15 NOTE — Progress Notes (Signed)
  Subjective:  Patient ID: Angela Graham, female    DOB: 1975/10/28,  MRN: [id number]  Chief Complaint  Patient presents with   Foot Pain    "I have bunions." N - bunions L - bilateral D -20 yrs + O - gradually worse C - tender, throb, ache A - walking, standing, shoes, pressure T - none    47 y.o. female presents with the above complaint. History confirmed with patient.  They are progressively getting worse she is having difficulty getting appropriate shoe gear to fit them that is comfortable.  Anti-inflammatories are not helpful when it hurts.  Objective:  Physical Exam: warm, good capillary refill, no trophic changes or ulcerative lesions, normal DP and PT pulses, normal sensory exam, and bilaterally has painful hallux valgus deformity with deviation of the great toe, dorsal medial eminence, left worse than right in severity, significant hypermobility at the first TMT   Radiographs: Multiple views x-ray of both feet: no fracture, dislocation, swelling or degenerative changes noted and hallux valgus deformity, increased intermetatarsal angle, deviated hallux and increased hallux abductus angle, valgus rotation of the toe and sesamoid complex Assessment:   1. Valgus deformity of both great toes   2. Hallux valgus with bunions, right   3. Hallux valgus with bunions, left      Plan:  Patient was evaluated and treated and all questions answered.   Discussed the etiology and treatment including surgical and non surgical treatment for painful bunions.  She has exhausted all non surgical treatment prior to this visit including shoe gear changes and padding.  She desires surgical intervention. We discussed all risks including but not limited to: pain, swelling, infection, scar, numbness which may be temporary or permanent, chronic pain, stiffness, nerve pain or damage, wound healing problems, bone healing problems including delayed or non-union and recurrence. Specifically we discussed  the following procedures: Lapidus bunionectomy, possible Akin osteotomy of the left foot. Informed consent was signed today. Surgery will be scheduled at a mutually agreeable date. Information regarding this will be forwarded to our surgery scheduler. In the interim until surgery I recommended utilizing as wide of shoes as possible, take NSAIDs or tylenol as tolerated for pain, and a bunion padding shield which can be purchased online.  We will plan to do the contralateral limb 3 to 4 months later pending her progress   Surgical plan:  Procedure: -Left foot Lapidus bunionectomy, Akin osteotomy  Location: -[facility]  Anesthesia plan: -IV sedation with regional block  Postoperative pain plan: - Tylenol 1000 mg every 6 hours, ibuprofen 600 mg every 6 hours, gabapentin 300 mg every 8 hours x5 days, oxycodone 5 mg 1-2 tabs every 6 hours only as needed  DVT prophylaxis: -None required  WB Restrictions / DME needs: -NWB in CAM boot with knee scooter    No follow-ups on file.

## 2023-07-01 ENCOUNTER — Telehealth: Payer: Self-pay | Admitting: Podiatry

## 2023-07-01 NOTE — Telephone Encounter (Signed)
SPOKE WITH KIM B. WITH [employer] TO GET A PARTIALLY APPROVED AUTH CANCELLED, PRIOR TO STARTING A NEW AUTH FOR PTS 07/05/2023 SURGERY. A NEW AUTH HAS BEEN SUBMITTED, AWAITING DETERMINATION. PER KIM B WITH [employer], CASE CANCELLATION SHOULD GO THROUGH IN 24-48 HOURS.  CALL REFERENCE NUMBER: [id number]

## 2023-07-05 ENCOUNTER — Telehealth: Payer: Self-pay | Admitting: Podiatry

## 2023-07-05 NOTE — Telephone Encounter (Signed)
I CALLED [employer] ON BEHALF OF THE PT TO HAVE A PEER TO PEER SET UP FOR A DENIAL RECEIVED REGARDING PTS SURGERY. I SPOKE WITH JOHN N FROM [facility] AND A PEER TO PEER HAS BEEN SET UP FOR DR.MCDONALD TO SPEAK WITH DR.WONDER FROM [employer]. THE PEER TO PEER IS SCHEDULED FOR TUESDAY 07/09/2023 @11 :30 AM.  CALL REFERNCE NUMBER: [id number]

## 2023-07-10 ENCOUNTER — Telehealth: Payer: Self-pay | Admitting: Podiatry

## 2023-07-10 ENCOUNTER — Encounter: Payer: 59 | Admitting: Podiatry

## 2023-07-10 NOTE — Telephone Encounter (Signed)
Called and left pt a voicemail informing her that we are still waiting to hear back from her insurance regarding surgery authorization and that her post ops from her original surgery date have been cancelled and that she does not need to show up for those.

## 2023-07-22 ENCOUNTER — Telehealth: Payer: Self-pay | Admitting: Podiatry

## 2023-07-22 NOTE — Telephone Encounter (Signed)
DOS-08/16/2023  LAPIDUS [employer]. BUNIONECTOMY-[id number]  [facility] EFFECTIVE DATE-10/10/2022  DEDUCTIBLE- $1600.00 WITH REMAINING $0.00 OOP-$4800.00 WITH REMAINING $1080.96 COINSURANCE- 20%  PER THE [employer] WEBSITE PORTAL, PRIOR AUTH HAS BEEN APPROVED FOR CPT CODE 30865. GOOD FROM 07/12/2023 - 10/10/2023.  AUTH REFERENCE #: [id number]

## 2023-07-24 ENCOUNTER — Encounter: Payer: 59 | Admitting: Podiatry

## 2023-07-31 ENCOUNTER — Encounter: Payer: 59 | Admitting: Podiatry

## 2023-08-14 ENCOUNTER — Encounter: Payer: 59 | Admitting: Podiatry

## 2023-08-16 ENCOUNTER — Encounter: Payer: Self-pay | Admitting: Podiatry

## 2023-08-16 ENCOUNTER — Other Ambulatory Visit: Payer: Self-pay | Admitting: Podiatry

## 2023-08-16 DIAGNOSIS — M2012 Hallux valgus (acquired), left foot: Secondary | ICD-10-CM | POA: Diagnosis not present

## 2023-08-16 MED ORDER — OXYCODONE HCL 5 MG PO TABS: 5.0000 mg | ORAL_TABLET | ORAL | 0 refills | Status: AC | PRN

## 2023-08-16 MED ORDER — IBUPROFEN 600 MG PO TABS: 600.0000 mg | ORAL_TABLET | Freq: Four times a day (QID) | ORAL | 0 refills | Status: AC | PRN

## 2023-08-16 MED ORDER — GABAPENTIN 300 MG PO CAPS: 300.0000 mg | ORAL_CAPSULE | Freq: Three times a day (TID) | ORAL | 0 refills | Status: DC

## 2023-08-16 MED ORDER — ACETAMINOPHEN 500 MG PO TABS: 1000.0000 mg | ORAL_TABLET | Freq: Four times a day (QID) | ORAL | 0 refills | Status: AC | PRN

## 2023-08-16 MED ORDER — ASPIRIN 81 MG PO TBEC: 81.0000 mg | DELAYED_RELEASE_TABLET | Freq: Every day | ORAL | 0 refills | Status: AC

## 2023-08-16 NOTE — Progress Notes (Signed)
08/16/23 L bunion correction

## 2023-08-20 ENCOUNTER — Other Ambulatory Visit: Payer: Self-pay | Admitting: Podiatry

## 2023-08-26 ENCOUNTER — Encounter: Payer: 59 | Admitting: Podiatry

## 2023-08-27 ENCOUNTER — Ambulatory Visit (INDEPENDENT_AMBULATORY_CARE_PROVIDER_SITE_OTHER): Payer: 59 | Admitting: Podiatry

## 2023-08-27 ENCOUNTER — Encounter: Payer: Self-pay | Admitting: Podiatry

## 2023-08-27 ENCOUNTER — Encounter: Payer: 59 | Admitting: Podiatry

## 2023-08-27 ENCOUNTER — Ambulatory Visit (INDEPENDENT_AMBULATORY_CARE_PROVIDER_SITE_OTHER): Payer: 59

## 2023-08-27 DIAGNOSIS — M21612 Bunion of left foot: Secondary | ICD-10-CM

## 2023-08-27 DIAGNOSIS — M2012 Hallux valgus (acquired), left foot: Secondary | ICD-10-CM | POA: Diagnosis not present

## 2023-08-27 NOTE — Progress Notes (Signed)
 She presents today as a patient of Dr. Darron.  She states that she has been doing really well has not had a problem with medications.  She would like to stop her gabapentin  if possible.  She states that she has not been using any scooter has been using crutches and has been touching her heel down occasionally.  Denies fever chills nausea vomit muscle aches pains calf pain back pain chest pain shortness of breath.  Objective: Presents today utilizing crutches nonweightbearing fashion.  She is in a short cam boot.  Dry sterile dressing intact once removed demonstrates moderate edema no erythema cellulitis drainage or odor she has good passive range of motion at the first metatarsophalangeal joint and the correction of the bunion deformity appears good.  Radiographically the Lapa plasty procedure has corrected very well internal fixation is intact and appears to be holding correction.  Assessment: Well-healing surgical foot left  Plan: Redressed today with just a compressive dressing continue nonweightbearing status follow-up Dr. Silva in 1 week

## 2023-08-29 ENCOUNTER — Other Ambulatory Visit: Payer: Self-pay | Admitting: Podiatry

## 2023-09-11 ENCOUNTER — Encounter: Payer: 59 | Admitting: Podiatry

## 2023-09-13 ENCOUNTER — Other Ambulatory Visit: Payer: 59

## 2023-09-16 ENCOUNTER — Encounter: Payer: Self-pay | Admitting: Podiatry

## 2023-09-16 ENCOUNTER — Ambulatory Visit (INDEPENDENT_AMBULATORY_CARE_PROVIDER_SITE_OTHER): Payer: 59 | Admitting: Podiatry

## 2023-09-16 ENCOUNTER — Ambulatory Visit (INDEPENDENT_AMBULATORY_CARE_PROVIDER_SITE_OTHER): Payer: 59

## 2023-09-16 VITALS — Ht 66.5 in | Wt 173.2 lb

## 2023-09-16 DIAGNOSIS — M2012 Hallux valgus (acquired), left foot: Secondary | ICD-10-CM | POA: Diagnosis not present

## 2023-09-16 DIAGNOSIS — M21612 Bunion of left foot: Secondary | ICD-10-CM

## 2023-09-16 NOTE — Progress Notes (Signed)
  Subjective:  Patient ID: Angela Graham, female    DOB: 1975-11-18,  MRN: [id number]  Chief Complaint  Patient presents with   Routine Post Op    Pt is here for 2nd post op visit.    DOS: 08/16/2023 Procedure: Left foot Lapidus bunionectomy  48 y.o. female returns for post-op check.  She is doing well pain remains well-controlled she has been using crutches in the boot and has not put weight on the foot yet.  Review of Systems: Negative except as noted in the HPI. Denies N/V/F/Ch.   Objective:  There were no vitals filed for this visit. Body mass index is 27.54 kg/m. Constitutional Well developed. Well nourished.  Vascular Foot warm and well perfused. Capillary refill normal to all digits.  Calf is soft and supple, no posterior calf or knee pain, negative Homans' sign  Neurologic Normal speech. Oriented to person, place, and time. Epicritic sensation to light touch grossly present bilaterally.  Dermatologic Skin healing well without signs of infection. Skin edges well coapted without signs of infection.  Orthopedic: Tenderness to palpation noted about the surgical site.   Multiple view plain film radiographs: Good correction noted no complication of hardware or bone there is early consolidation across the fusion site Assessment:   1. Hallux valgus with bunions, left    Plan:  Patient was evaluated and treated and all questions answered.  S/p foot surgery left -Progressing as expected post-operatively.  Doing well may begin full weightbearing as tolerated in cam boot.  Sutures removed uneventfully today she may resume regular bathing and showering.  Compression sleeve applied.  Encourage range of motion exercises of the first MTP joint.  Return to see me in 4 weeks for new x-rays and transition to regular shoe gear.    Return in about 4 weeks (around 10/14/2023) for post op (new x-rays).

## 2023-10-14 ENCOUNTER — Ambulatory Visit (INDEPENDENT_AMBULATORY_CARE_PROVIDER_SITE_OTHER): Payer: 59

## 2023-10-14 ENCOUNTER — Encounter: Payer: Self-pay | Admitting: Podiatry

## 2023-10-14 ENCOUNTER — Ambulatory Visit (INDEPENDENT_AMBULATORY_CARE_PROVIDER_SITE_OTHER): Payer: 59 | Admitting: Podiatry

## 2023-10-14 DIAGNOSIS — Z9889 Other specified postprocedural states: Secondary | ICD-10-CM

## 2023-10-14 DIAGNOSIS — M2012 Hallux valgus (acquired), left foot: Secondary | ICD-10-CM

## 2023-10-14 NOTE — Progress Notes (Signed)
  Subjective:  Patient ID: Angela Graham, female    DOB: December 19, 1975,  MRN: [id number]  Chief Complaint  Patient presents with   Routine Post Op    POV#3  DOS: 08/16/2023  Left foot Lapidus bunionectomy "It's doing okay, I get these weird sensations in it."     DOS: 08/16/2023 Procedure: Left foot Lapidus bunionectomy  48 y.o. female returns for post-op check.  Doing well pain is controlled she did have a slight infection in the interspace incision that her primary care doctor prescribed her mupirocin (resolved  Review of Systems: Negative except as noted in the HPI. Denies N/V/F/Ch.   Objective:  There were no vitals filed for this visit. There is no height or weight on file to calculate BMI. Constitutional Well developed. Well nourished.  Vascular Foot warm and well perfused. Capillary refill normal to all digits.  Calf is soft and supple, no posterior calf or knee pain, negative Homans' sign  Neurologic Normal speech. Oriented to person, place, and time. Epicritic sensation to light touch grossly present bilaterally.  Dermatologic Scars are well-healed not hypertrophic  Orthopedic: No pain to palpation noted about the surgical site.  Good MTP range of motion   Multiple view plain film radiographs: Good correction compared to preoperative films does have slight hallux abductus that is residual Assessment:   1. Status post surgery    Plan:  Patient was evaluated and treated and all questions answered.  S/p foot surgery left -Overall doing well.  Return to regular shoe gear and activity gradually, avoid impact activity for 1 more month such as running jogging jumping but may walk for exercise as much as she tolerates.  Return in 2 months for final x-rays.  She would like to plan for right foot surgery in October.    Return in about 2 months (around 12/12/2023) for surgery follow up (new xrays).

## 2023-10-25 ENCOUNTER — Ambulatory Visit
Admission: RE | Admit: 2023-10-25 | Discharge: 2023-10-25 | Disposition: A | Discharge status: Home / Self Care | Payer: 59 | Source: Ambulatory Visit | Attending: Obstetrics and Gynecology | Admitting: Obstetrics and Gynecology

## 2023-10-25 DIAGNOSIS — N6310 Unspecified lump in the right breast, unspecified quadrant: Secondary | ICD-10-CM

## 2023-11-11 ENCOUNTER — Encounter: Payer: Self-pay | Admitting: Gastroenterology

## 2023-11-27 ENCOUNTER — Ambulatory Visit (INDEPENDENT_AMBULATORY_CARE_PROVIDER_SITE_OTHER): Admitting: Podiatry

## 2023-11-27 ENCOUNTER — Ambulatory Visit (INDEPENDENT_AMBULATORY_CARE_PROVIDER_SITE_OTHER)

## 2023-11-27 ENCOUNTER — Encounter: Payer: Self-pay | Admitting: Podiatry

## 2023-11-27 DIAGNOSIS — M21612 Bunion of left foot: Secondary | ICD-10-CM | POA: Diagnosis not present

## 2023-11-27 DIAGNOSIS — S9032XA Contusion of left foot, initial encounter: Secondary | ICD-10-CM

## 2023-11-27 DIAGNOSIS — M2012 Hallux valgus (acquired), left foot: Secondary | ICD-10-CM

## 2023-11-27 DIAGNOSIS — M84375A Stress fracture, left foot, initial encounter for fracture: Secondary | ICD-10-CM | POA: Diagnosis not present

## 2023-11-30 NOTE — Progress Notes (Signed)
  Subjective:  Patient ID: Angela Graham, female    DOB: Jun 24, 1976,  MRN: [id number]  Chief Complaint  Patient presents with   Routine Post Op     POV#3  DOS: 08/16/2023  Left foot Lapidus bunionectomy "My foot is hurting me so bad.  It hurts on the top and it started swelling again.  I tripped over a board thing but the first day it didn't hurt then it gradually started to hurt.  I hope I didn't mess up anything.  The pain level is a six.  It hurts to touch."     DOS: 08/16/2023 Procedure: Left foot Lapidus bunionectomy  48 y.o. female returns for post-op check.  Most of the pain is around the second and third toes  Review of Systems: Negative except as noted in the HPI. Denies N/V/F/Ch.   Objective:  There were no vitals filed for this visit. There is no height or weight on file to calculate BMI. Constitutional Well developed. Well nourished.  Vascular Foot warm and well perfused. Capillary refill normal to all digits.  Calf is soft and supple, no posterior calf or knee pain, negative Homans' sign  Neurologic Normal speech. Oriented to person, place, and time. Epicritic sensation to light touch grossly present bilaterally.  Dermatologic Scars are well-healed not hypertrophic  Orthopedic: No pain to palpation noted about the surgical site.  Good MTP range of motion. Her pain is centered over the distal 2nd metatarsal, good ROM of MTP of 2/3   Multiple view plain film radiographs: Correction maintained, no complication of hardware, no acute fracture or definitive stress fracture Assessment:   1. Hallux valgus with bunions, left   2. Contusion of left foot, initial encounter   3. Stress fracture of left foot, initial encounter    Plan:  Patient was evaluated and treated and all questions answered.  S/p foot surgery left -From a surgical standpoint doing well and is fully healed. She does seem to have a stress reaction of the second metatarsal from recent injury. Advised  to wear boot for next 1-3 weeks and RICE. No definitive fx on XR but pain is in the area of the positioner wire into the 2nd met which we discussed can be a weakened area of bone. F/U in 4 weeks for new xrays if still painful     No follow-ups on file.

## 2023-12-20 ENCOUNTER — Ambulatory Visit

## 2023-12-20 VITALS — Ht 66.5 in | Wt 174.0 lb

## 2023-12-20 DIAGNOSIS — Z1211 Encounter for screening for malignant neoplasm of colon: Secondary | ICD-10-CM

## 2023-12-20 MED ORDER — NA SULFATE-K SULFATE-MG SULF 17.5-3.13-1.6 GM/177ML PO SOLN: 1.0000 | Freq: Once | ORAL | 0 refills | Status: AC

## 2023-12-20 NOTE — Progress Notes (Signed)

## 2023-12-23 ENCOUNTER — Ambulatory Visit

## 2023-12-23 ENCOUNTER — Encounter: Payer: Self-pay | Admitting: Podiatry

## 2023-12-23 ENCOUNTER — Ambulatory Visit (INDEPENDENT_AMBULATORY_CARE_PROVIDER_SITE_OTHER): Payer: 59 | Admitting: Podiatry

## 2023-12-23 VITALS — Ht 66.5 in | Wt 174.0 lb

## 2023-12-23 DIAGNOSIS — M21612 Bunion of left foot: Secondary | ICD-10-CM | POA: Diagnosis not present

## 2023-12-23 DIAGNOSIS — S9032XD Contusion of left foot, subsequent encounter: Secondary | ICD-10-CM | POA: Diagnosis not present

## 2023-12-23 DIAGNOSIS — M2012 Hallux valgus (acquired), left foot: Secondary | ICD-10-CM

## 2023-12-23 DIAGNOSIS — M84375D Stress fracture, left foot, subsequent encounter for fracture with routine healing: Secondary | ICD-10-CM | POA: Diagnosis not present

## 2023-12-24 ENCOUNTER — Encounter

## 2023-12-24 ENCOUNTER — Encounter: Payer: Self-pay | Admitting: Gastroenterology

## 2023-12-25 NOTE — Progress Notes (Signed)
  Subjective:  Patient ID: Angela Graham, female    DOB: Feb 21, 1976,  MRN: [id number]  Chief Complaint  Patient presents with   Routine Post Op    57M F/U for bunion surgery    DOS: 08/16/2023 Procedure: Left foot Lapidus bunionectomy  48 y.o. female returns for post-op check.  She notes she is doing much better  Review of Systems: Negative except as noted in the HPI. Denies N/V/F/Ch.   Objective:  There were no vitals filed for this visit. Body mass index is 27.66 kg/m. Constitutional Well developed. Well nourished.  Vascular Foot warm and well perfused. Capillary refill normal to all digits.  Calf is soft and supple, no posterior calf or knee pain, negative Homans' sign  Neurologic Normal speech. Oriented to person, place, and time. Epicritic sensation to light touch grossly present bilaterally.  Dermatologic Scars are well-healed not hypertrophic  Orthopedic: No pain to palpation noted about the surgical site.  Good MTP range of motion.  No pain over second metatarsal today   Multiple view plain film radiographs: Correction maintained, no complication of hardware, no acute fracture or definitive stress fracture Assessment:   1. Hallux valgus with bunions, left   2. Contusion of left foot, subsequent encounter   3. Stress fracture of left foot with routine healing, subsequent encounter    Plan:  Patient was evaluated and treated and all questions answered.  S/p foot surgery left - She is doing well not having any pain.  Regular shoes and activity as tolerated.  Continue range of motion of the great toe joint.  New radiographs not taken today since she has improved greatly but return as needed and we will take new films if needed in regards to the second metatarsal stress reaction, her surgical site was well-healed at her last exam    No follow-ups on file.

## 2024-01-03 ENCOUNTER — Encounter: Payer: Self-pay | Admitting: Gastroenterology

## 2024-01-03 ENCOUNTER — Ambulatory Visit (AMBULATORY_SURGERY_CENTER): Admitting: Gastroenterology

## 2024-01-03 VITALS — BP 138/83 | HR 90 | Temp 98.6°F | Resp 14 | Ht 66.5 in | Wt 174.0 lb

## 2024-01-03 DIAGNOSIS — K648 Other hemorrhoids: Secondary | ICD-10-CM | POA: Diagnosis not present

## 2024-01-03 DIAGNOSIS — Z1211 Encounter for screening for malignant neoplasm of colon: Secondary | ICD-10-CM | POA: Diagnosis present

## 2024-01-03 DIAGNOSIS — K644 Residual hemorrhoidal skin tags: Secondary | ICD-10-CM | POA: Diagnosis not present

## 2024-01-03 MED ORDER — SODIUM CHLORIDE 0.9 % IV SOLN: 500.0000 mL | Freq: Once | INTRAVENOUS | Status: DC

## 2024-01-03 NOTE — Patient Instructions (Addendum)
-  Handout on hemorrhoids provided -repeat colonoscopy for surveillance in 10 years recommended -Continue present medications    YOU HAD AN ENDOSCOPIC PROCEDURE TODAY AT THE [facility]:   Refer to the procedure report that was given to you for any specific questions about what was found during the examination.  If the procedure report does not answer your questions, please call your gastroenterologist to clarify.  If you requested that your care partner not be given the details of your procedure findings, then the procedure report has been included in a sealed envelope for you to review at your convenience later.  YOU SHOULD EXPECT: Some feelings of bloating in the abdomen. Passage of more gas than usual.  Walking can help get rid of the air that was put into your GI tract during the procedure and reduce the bloating. If you had a lower endoscopy (such as a colonoscopy or flexible sigmoidoscopy) you may notice spotting of blood in your stool or on the toilet paper. If you underwent a bowel prep for your procedure, you may not have a normal bowel movement for a few days.  Please Note:  You might notice some irritation and congestion in your nose or some drainage.  This is from the oxygen used during your procedure.  There is no need for concern and it should clear up in a day or so.  SYMPTOMS TO REPORT IMMEDIATELY:  Following lower endoscopy (colonoscopy or flexible sigmoidoscopy):  Excessive amounts of blood in the stool  Significant tenderness or worsening of abdominal pains  Swelling of the abdomen that is new, acute  Fever of 100F or higher  For urgent or emergent issues, a gastroenterologist can be reached at any hour by calling [phone]. Do not use MyChart messaging for urgent concerns.    DIET:  We do recommend a small meal at first, but then you may proceed to your regular diet.  Drink plenty of fluids but you should avoid alcoholic beverages for 24 hours.  ACTIVITY:   You should plan to take it easy for the rest of today and you should NOT DRIVE or use heavy machinery until tomorrow (because of the sedation medicines used during the test).    FOLLOW UP: Our staff will call the number listed on your records the next business day following your procedure.  We will call around 7:15- 8:00 am to check on you and address any questions or concerns that you may have regarding the information given to you following your procedure. If we do not reach you, we will leave a message.     If any biopsies were taken you will be contacted by phone or by letter within the next 1-3 weeks.  Please call us at [phone] if you have not heard about the biopsies in 3 weeks.    SIGNATURES/CONFIDENTIALITY: You and/or your care partner have signed paperwork which will be entered into your electronic medical record.  These signatures attest to the fact that that the information above on your After Visit Summary has been reviewed and is understood.  Full responsibility of the confidentiality of this discharge information lies with you and/or your care-partner.

## 2024-01-03 NOTE — Op Note (Signed)
 [facility] Patient Name: Angela Graham Procedure Date: 01/03/2024 11:56 AM MRN: [id number] Endoscopist: Sergio Dandy , MD, [id number] Age: 48 Referring MD:  Date of Birth: 1976/01/27 Gender: Female Account #: [id number] Procedure:                Colonoscopy Indications:              Screening for colorectal malignant neoplasm Medicines:                Monitored Anesthesia Care Procedure:                Pre-Anesthesia Assessment:                           - Prior to the procedure, a History and Physical                            was performed, and patient medications and                            allergies were reviewed. The patient's tolerance of                            previous anesthesia was also reviewed. The risks                            and benefits of the procedure and the sedation                            options and risks were discussed with the patient.                            All questions were answered, and informed consent                            was obtained. Prior Anticoagulants: The patient has                            taken no anticoagulant or antiplatelet agents. ASA                            Grade Assessment: II - A patient with mild systemic                            disease. After reviewing the risks and benefits,                            the patient was deemed in satisfactory condition to                            undergo the procedure.                           After obtaining informed consent, the colonoscope  was passed under direct vision. Throughout the                            procedure, the patient's blood pressure, pulse, and                            oxygen saturations were monitored continuously. The                            Olympus Scope [id number] was introduced through the                            anus and advanced to the the cecum, identified by                             appendiceal orifice and ileocecal valve. The                            colonoscopy was performed without difficulty. The                            patient tolerated the procedure well. The quality                            of the bowel preparation was good. The ileocecal                            valve, appendiceal orifice, and rectum were                            photographed. Scope In: 12:02:57 PM Scope Out: 12:14:26 PM Scope Withdrawal Time: 0 hours 8 minutes 3 seconds  Total Procedure Duration: 0 hours 11 minutes 29 seconds  Findings:                 The perianal and digital rectal examinations were                            normal.                           Non-bleeding external and internal hemorrhoids were                            found during retroflexion. The hemorrhoids were                            medium-sized.                           The exam was otherwise without abnormality. Complications:            No immediate complications. Estimated Blood Loss:     Estimated blood loss: none. Impression:               - Non-bleeding external and internal hemorrhoids.                           -  The examination was otherwise normal.                           - No specimens collected. Recommendation:           - Patient has a contact number available for                            emergencies. The signs and symptoms of potential                            delayed complications were discussed with the                            patient. Return to normal activities tomorrow.                            Written discharge instructions were provided to the                            patient.                           - Resume previous diet.                           - Continue present medications.                           - Repeat colonoscopy in 10 years for surveillance. Shahzaib Azevedo V. Infant Doane, MD 01/03/2024 12:23:40 PM This report has been signed electronically.

## 2024-01-03 NOTE — Progress Notes (Signed)
 [facility] History and Physical   Primary Care Physician:  Victorio Grave, MD   Reason for Procedure:  Colorectal cancer screening  Plan:    Screening colonoscopy with possible interventions as needed     HPI: Angela Graham is a very pleasant 48 y.o. female here for screening colonoscopy. Denies any nausea, vomiting, abdominal pain, melena or bright red blood per rectum  The risks and benefits as well as alternatives of endoscopic procedure(s) have been discussed and reviewed. All questions answered. The patient agrees to proceed.    Past Medical History:  Diagnosis Date   Anemia    as a teenager   HTN in pregnancy, chronic 01/29/2013   History - resolved after delivery   Hypertension    no meds   PONV (postoperative nausea and vomiting)    Termination of pregnancy (fetus) 1996, 2003   x 2    Past Surgical History:  Procedure Laterality Date   ABLATION     2024   BUNIONECTOMY Left    2024   CESAREAN SECTION N/A 02/09/2013   Procedure: Primary CESAREAN SECTION of baby girl at 0149 APGAR 7/9;  Surgeon: Madelene Schanz, MD;  Location: [facility];  Service: Obstetrics;  Laterality: N/A;   CESAREAN SECTION WITH BILATERAL TUBAL LIGATION Bilateral 06/29/2014   Procedure: REPEAT CESAREAN SECTION WITH BILATERAL TUBAL LIGATION;  Surgeon: Denette Finner, MD;  Location: [facility];  Service: Obstetrics;  Laterality: Bilateral;  repeat edc 07/04/14   DILATION AND CURETTAGE OF UTERUS     x 2 EAB   WISDOM TOOTH EXTRACTION     WISDOM TOOTH EXTRACTION      Prior to Admission medications   Medication Sig Start Date End Date Taking? Authorizing Provider  amLODipine (NORVASC) 5 MG tablet Take 1 tablet by mouth daily. 10/04/21  Yes [provider]  Multiple Vitamin (MULTIVITAMIN) tablet Take 1 tablet by mouth daily.   Yes [provider]  triamcinolone cream (KENALOG) 0.1 % Apply 1 Application topically 2 (two) times daily.   Yes [provider]   gabapentin  (NEURONTIN ) 300 MG capsule TAKE 1 CAPSULE(300 MG) BY MOUTH THREE TIMES DAILY FOR 7 DAYS 08/20/23   Standiford, Karlene Overcast, DPM    Current Outpatient Medications  Medication Sig Dispense Refill   amLODipine (NORVASC) 5 MG tablet Take 1 tablet by mouth daily.     Multiple Vitamin (MULTIVITAMIN) tablet Take 1 tablet by mouth daily.     triamcinolone cream (KENALOG) 0.1 % Apply 1 Application topically 2 (two) times daily.     gabapentin  (NEURONTIN ) 300 MG capsule TAKE 1 CAPSULE(300 MG) BY MOUTH THREE TIMES DAILY FOR 7 DAYS 21 capsule 0   Current Facility-Administered Medications  Medication Dose Route Frequency Provider Last Rate Last Admin   0.9 %  sodium chloride  infusion  500 mL Intravenous Once Trestan Vahle V, MD        Allergies as of 01/03/2024   (No Known Allergies)    Family History  Problem Relation Age of Onset   Depression Mother    Hypertension Mother    Cancer Father 49       prostate   Colon polyps Sister    Hypertension Brother    Hypertension Brother    Cancer Paternal Uncle        prostate   Depression Maternal Grandmother    Heart disease Maternal Grandmother        CHF   Hypertension Maternal Grandmother    Stroke Maternal Grandmother  Cancer Maternal Grandfather        brain   Colon cancer Paternal Grandfather    Cancer Paternal Grandfather        colon   Breast cancer Neg Hx    Rectal cancer Neg Hx    Stomach cancer Neg Hx    Esophageal cancer Neg Hx     Social History   Socioeconomic History   Marital status: Married    Spouse name: Shontaye Oyama   Number of children: 2   Years of education: 16   Highest education level: Master's degree (e.g., MA, MS, MEng, MEd, MSW, MBA)  Occupational History   Occupation: speech therapist  Tobacco Use   Smoking status: Never   Smokeless tobacco: Never  Vaping Use   Vaping status: Never Used  Substance and Sexual Activity   Alcohol use: Yes    Comment: rarely   Drug use: No    Sexual activity: Yes    Birth control/protection: None  Other Topics Concern   Not on file  Social History Narrative   ** Merged History Encounter **      Patient is right-handed. She lives with her husband and 2 children in a one level home. She drinks 1 cup of coffee a day, 5 days a week. She walks 1-2 miles a day, 5 x a week.       Social Drivers of Corporate investment banker Strain: Not on file  Food Insecurity: Not on file  Transportation Needs: Not on file  Physical Activity: Not on file  Stress: Not on file  Social Connections: Not on file  Intimate Partner Violence: Not on file    Review of Systems:  All other review of systems negative except as mentioned in the HPI.  Physical Exam: Vital signs in last 24 hours: BP (!) 137/95   Pulse 78   Temp 98.6 F (37 C)   Ht 5' 6.5" (1.689 m)   Wt 174 lb (78.9 kg)   LMP 12/16/2023   SpO2 100%   BMI 27.66 kg/m  General:   Alert, NAD Lungs:  Clear .   Heart:  Regular rate and rhythm Abdomen:  Soft, nontender and nondistended. Neuro/Psych:  Alert and cooperative. Normal mood and affect. A and O x 3  Reviewed labs, radiology imaging, old records and pertinent past GI work up  Patient is appropriate for planned procedure(s) and anesthesia in an ambulatory setting   K. Veena Stevey Stapleton , MD [phone]

## 2024-01-03 NOTE — Progress Notes (Signed)
 Pt's states no medical or surgical changes since previsit or office visit.

## 2024-01-03 NOTE — Progress Notes (Signed)
 Vss nad trans to pacu

## 2024-01-06 ENCOUNTER — Telehealth: Payer: Self-pay | Admitting: *Deleted

## 2024-01-06 NOTE — Telephone Encounter (Signed)
  Follow up Call-     01/03/2024   10:56 AM  Call back number  Post procedure Call Back phone  # [phone]  Permission to leave phone message Yes     Patient questions:  Do you have a fever, pain , or abdominal swelling? No. Pain Score  0 *  Have you tolerated food without any problems? Yes.    Have you been able to return to your normal activities? Yes.    Do you have any questions about your discharge instructions: Diet   No. Medications  No. Follow up visit  No.  Do you have questions or concerns about your Care? No.  Actions: * If pain score is 4 or above: No action needed, pain <4.

## 2024-04-01 ENCOUNTER — Ambulatory Visit: Attending: Family Medicine

## 2024-04-01 ENCOUNTER — Other Ambulatory Visit: Payer: Self-pay | Admitting: *Deleted

## 2024-04-01 DIAGNOSIS — R002 Palpitations: Secondary | ICD-10-CM

## 2024-04-01 DIAGNOSIS — I493 Ventricular premature depolarization: Secondary | ICD-10-CM

## 2024-04-01 DIAGNOSIS — I498 Other specified cardiac arrhythmias: Secondary | ICD-10-CM

## 2024-04-01 NOTE — Progress Notes (Unsigned)
 Enrolled for [employer] to mail a ZIO XT long term holter monitor to the patients address on file.   EP to read

## 2024-04-13 ENCOUNTER — Ambulatory Visit: Admitting: Cardiology

## 2024-04-20 ENCOUNTER — Encounter: Payer: Self-pay | Admitting: Plastic Surgery

## 2024-04-20 ENCOUNTER — Ambulatory Visit (INDEPENDENT_AMBULATORY_CARE_PROVIDER_SITE_OTHER): Admitting: Plastic Surgery

## 2024-04-20 VITALS — BP 149/92 | HR 80 | Ht 66.5 in | Wt 180.0 lb

## 2024-04-20 DIAGNOSIS — R220 Localized swelling, mass and lump, head: Secondary | ICD-10-CM | POA: Diagnosis not present

## 2024-04-20 DIAGNOSIS — M898X8 Other specified disorders of bone, other site: Secondary | ICD-10-CM | POA: Insufficient documentation

## 2024-04-20 NOTE — Progress Notes (Signed)
 Patient ID: Angela Graham, female    DOB: [birth year], 48 y.o.   MRN: [id number]   Chief Complaint  Patient presents with   Consult         The patient is a 48 year old female here for evaluation of her forehead.  The patient does not recall any injury to the forehead.  For the last 10 years or so she has had a mass on the right upper part of her forehead that has been getting larger.  It is firm and not mobile.  It is about 2 cm in size.  She has no surrounding areas of concern.  Past medical history is positive for hypertension and anemia as a teenager.  She has had surgery without complications in the past.    Review of Systems  Constitutional: Negative.   Eyes: Negative.   Respiratory: Negative.    Gastrointestinal: Negative.   Endocrine: Negative.   Genitourinary: Negative.   Musculoskeletal: Negative.     Past Medical History:  Diagnosis Date   Anemia    as a teenager   HTN in pregnancy, chronic 01/29/2013   History - resolved after delivery   Hypertension    no meds   PONV (postoperative nausea and vomiting)    Termination of pregnancy (fetus) 1996, 2003   x 2    Past Surgical History:  Procedure Laterality Date   ABLATION     2024   BUNIONECTOMY Left    2024   CESAREAN SECTION N/A 02/09/2013   Procedure: Primary CESAREAN SECTION of baby girl at 0149 APGAR 7/9;  Surgeon: Jon CINDERELLA Rummer, MD;  Location: [facility];  Service: Obstetrics;  Laterality: N/A;   CESAREAN SECTION WITH BILATERAL TUBAL LIGATION Bilateral 06/29/2014   Procedure: REPEAT CESAREAN SECTION WITH BILATERAL TUBAL LIGATION;  Surgeon: Alm JAYSON Cook, MD;  Location: [facility];  Service: Obstetrics;  Laterality: Bilateral;  repeat edc 07/04/14   DILATION AND CURETTAGE OF UTERUS     x 2 EAB   WISDOM TOOTH EXTRACTION     WISDOM TOOTH EXTRACTION        Current Outpatient Medications:    amLODipine (NORVASC) 5 MG tablet, Take 1 tablet by mouth daily., Disp: , Rfl:    Multiple Vitamin (MULTIVITAMIN)  tablet, Take 1 tablet by mouth daily., Disp: , Rfl:    triamcinolone cream (KENALOG) 0.1 %, Apply 1 Application topically 2 (two) times daily., Disp: , Rfl:    gabapentin  (NEURONTIN ) 300 MG capsule, TAKE 1 CAPSULE(300 MG) BY MOUTH THREE TIMES DAILY FOR 7 DAYS, Disp: 21 capsule, Rfl: 0   Objective:   Vitals:   04/20/24 1052  BP: (!) 149/92  Pulse: 80  SpO2: 97%    Physical Exam Vitals reviewed.  Constitutional:      Appearance: Normal appearance.  HENT:     Head:   Cardiovascular:     Rate and Rhythm: Normal rate.     Pulses: Normal pulses.  Pulmonary:     Effort: Pulmonary effort is normal.  Skin:    General: Skin is warm.     Capillary Refill: Capillary refill takes less than 2 seconds.  Neurological:     Mental Status: She is alert and oriented to person, place, and time.  Psychiatric:        Mood and Affect: Mood normal.        Behavior: Behavior normal.        Thought Content: Thought content normal.  Judgment: Judgment normal.     Assessment & Plan:  Skull mass  Plan for session in the OR of right forehead skull mass.  Most likely this is an osteoma.  Pictures were obtained of the patient and placed in the chart with the patient's or guardian's permission.   Angela RAMAN Corlis Angelica, DO

## 2024-04-29 DIAGNOSIS — R002 Palpitations: Secondary | ICD-10-CM

## 2024-04-29 DIAGNOSIS — I493 Ventricular premature depolarization: Secondary | ICD-10-CM | POA: Diagnosis not present

## 2024-04-29 DIAGNOSIS — I498 Other specified cardiac arrhythmias: Secondary | ICD-10-CM

## 2024-05-04 ENCOUNTER — Ambulatory Visit: Attending: Cardiology | Admitting: Cardiology

## 2024-05-04 ENCOUNTER — Encounter: Payer: Self-pay | Admitting: Cardiology

## 2024-05-04 VITALS — BP 122/86 | HR 84 | Resp 16 | Ht 66.0 in | Wt 185.8 lb

## 2024-05-04 DIAGNOSIS — R002 Palpitations: Secondary | ICD-10-CM | POA: Diagnosis not present

## 2024-05-04 DIAGNOSIS — I10 Essential (primary) hypertension: Secondary | ICD-10-CM | POA: Diagnosis not present

## 2024-05-04 DIAGNOSIS — I493 Ventricular premature depolarization: Secondary | ICD-10-CM

## 2024-05-04 MED ORDER — METOPROLOL TARTRATE 25 MG PO TABS: 12.5000 mg | ORAL_TABLET | Freq: Two times a day (BID) | ORAL | 0 refills | Status: AC

## 2024-05-04 NOTE — Patient Instructions (Addendum)
 Medication Instructions:  Please START lopressor  12.5 mg twice a day.   *If you need a refill on your cardiac medications before your next appointment, please call your pharmacy*  Lab Work: Please talk with your PCP about having a TSH drawn.  If you have labs (blood work) drawn today and your tests are completely normal, you will receive your results only by: MyChart Message (if you have MyChart) OR A paper copy in the mail If you have any lab test that is abnormal or we need to change your treatment, we will call you to review the results.  Testing/Procedures: Your physician has requested that you have an exercise tolerance test. For further information please visit [email or web address] Please also follow instruction sheet, as given.  Your physician has requested that you have an echocardiogram. Echocardiography is a painless test that uses sound waves to create images of your heart. It provides your doctor with information about the size and shape of your heart and how well your heart's chambers and valves are working. This procedure takes approximately one hour. There are no restrictions for this procedure. Please do NOT wear cologne, perfume, aftershave, or lotions (deodorant is allowed). Please arrive 15 minutes prior to your appointment time.  Please note: We ask at that you not bring children with you during ultrasound (echo/ vascular) testing. Due to room size and safety concerns, children are not allowed in the ultrasound rooms during exams. Our front office staff cannot provide observation of children in our lobby area while testing is being conducted. An adult accompanying a patient to their appointment will only be allowed in the ultrasound room at the discretion of the ultrasound technician under special circumstances. We apologize for any inconvenience.    Follow-Up: At [facility], you and your health needs are our priority.  As part of our continuing mission to  provide you with exceptional heart care, our providers are all part of one team.  This team includes your primary Cardiologist (physician) and Advanced Practice Providers or APPs (Physician Assistants and Nurse Practitioners) who all work together to provide you with the care you need, when you need it.  Your next appointment will be dependent on the results of your testing and it will be with:     Provider:   Dr. Madonna Large, MD

## 2024-05-04 NOTE — Progress Notes (Signed)
 Cardiology Office Note:  .   Date:  05/04/2024  ID:  Angela Graham, DOB [birth year], MRN [id number] PCP:  Teresa Channel, MD  Former Cardiology Providers: Dr. Claudene [facility] Providers Cardiologist:  Madonna Large, DO , [facility] (established care 05/04/24) Electrophysiologist:  None  Click to update primary MD,subspecialty MD or APP then REFRESH:1}    Chief Complaint  Patient presents with   Palpitations    History of Present Illness: .   Angela Graham is a 47 y.o. African-American female whose past medical history and cardiovascular risk factors includes: Hypertension, palpitations.   Formally under the care of Dr. Victory Claudene who last saw Angela Graham back in November 2023. I am seeing her for the first time to re-establishing care.   Palpitations: Chronic, ongoing for years. No significant change since her last office visit with Dr. Claudene back in November 2023. Occurs daily/weekly. Duration: 20 to 30 seconds. No near-syncope or syncopal events. No improving factors. More noticeable during cold weather.  Patient is a completed a cardiac monitor results reviewed.  Underlying rhythm sinus, average heart rate 84 bpm.  No atrial fibrillation detected during the monitoring period.  Tachycardia burden 11%.  150 patient triggered events during which the underlying rhythm was sinus or sinus with rare ectopy.  No heart blocks or pauses.  Personally reviewed the cardiac monitor patient had an episode of supraventricular tachycardia and not NSVT.  Factors to consider: History of thyroid  disease: no History of anemia: no, hx of as a kid Coffee consumption: 3 cups / week Caffeinated beverages/sodas: none Energy drinks: none Alcohol use: social Stimulants: none Illicit drugs: none Weight loss supplements: none New over-the-counter medications: none Herbal supplements: none Any prior workup: Zio several years ago.   No first degree relatives with premature coronary  disease or sudden cardiac death.  No structured exercise program or daily routine.  Review of Systems: .   Review of Systems  Cardiovascular:  Positive for palpitations. Negative for chest pain, claudication, irregular heartbeat, leg swelling, near-syncope, orthopnea, paroxysmal nocturnal dyspnea and syncope.  Respiratory:  Negative for shortness of breath.   Hematologic/Lymphatic: Negative for bleeding problem.    Studies Reviewed:   EKG: EKG Interpretation Date/Time:  Monday May 04 2024 08:11:08 EDT Ventricular Rate:  84 PR Interval:  156 QRS Duration:  76 QT Interval:  392 QTC Calculation: 463 R Axis:   106  Text Interpretation: Normal sinus rhythm Rightward axis When compared with ECG of 21-Jul-2003 21:00, No significant change since last tracing Confirmed by Large Madonna [phone]) on 05/04/2024 8:14:23 AM  Echocardiogram: December 2023 LVEF: 65-70% Diastolic Function: Normal Right ventricular size and function normal Regurgitation: Trivial MR Estimated RAP 3 mmHg See report for additional details  Cardiac monitor: September 2025 Patch Wear Time:  13 days and 23 hours   HR 54 - 169, average 84 bpm. 1 nonsustained SVT (longest 4 beats) No atrial fibrillation detected. Rare supraventricular ectopy. Rare ventricular ectopy.<1% No sustained arrhythmias. Symptom trigger episodes correspond to sinus rhythm and sinus rhythm with PVCs  RADIOLOGY: N/A  Risk Assessment/Calculations:   NA   Labs:       Latest Ref Rng & Units 12/01/2022   12:00 AM 02/08/2017    9:21 PM 06/30/2014    6:25 AM  CBC  WBC 4.0 - 10.5 K/uL 8.0  9.0  13.2   Hemoglobin 12.0 - 15.0 g/dL 87.2  86.8  89.6   Hematocrit 36.0 - 46.0 % 38.3  38.0  30.1   Platelets 150 - 400 K/uL 256  243  195        Latest Ref Rng & Units 12/01/2022   12:00 AM 02/08/2017    9:21 PM 02/16/2013    2:02 PM  BMP  Glucose 70 - 99 mg/dL 896  882  890   BUN 6 - 20 mg/dL 17  11  11    Creatinine 0.44 - 1.00 mg/dL  9.10  9.30  9.10   Sodium 135 - 145 mmol/L 137  137  140   Potassium 3.5 - 5.1 mmol/L 3.8  4.6  3.5   Chloride 98 - 111 mmol/L 104  105  104   CO2 22 - 32 mmol/L 27  23  27    Calcium  8.9 - 10.3 mg/dL 9.3  9.5  9.3       Latest Ref Rng & Units 12/01/2022   12:00 AM 02/08/2017    9:21 PM 02/16/2013    2:02 PM  CMP  Glucose 70 - 99 mg/dL 896  882  890   BUN 6 - 20 mg/dL 17  11  11    Creatinine 0.44 - 1.00 mg/dL 9.10  9.30  9.10   Sodium 135 - 145 mmol/L 137  137  140   Potassium 3.5 - 5.1 mmol/L 3.8  4.6  3.5   Chloride 98 - 111 mmol/L 104  105  104   CO2 22 - 32 mmol/L 27  23  27    Calcium  8.9 - 10.3 mg/dL 9.3  9.5  9.3   Total Protein 6.5 - 8.1 g/dL  8.2  6.6   Total Bilirubin 0.3 - 1.2 mg/dL  0.7  0.2   Alkaline Phos 38 - 126 U/L  57  123   AST 15 - 41 U/L  22  31   ALT 14 - 54 U/L  20  22     No results found for: CHOL, HDL, LDLCALC, LDLDIRECT, TRIG, CHOLHDL No results for input(s): LIPOA in the last 8760 hours. No components found for: NTPROBNP No results for input(s): PROBNP in the last 8760 hours. No results for input(s): TSH in the last 8760 hours.  Physical Exam:    Today's Vitals   05/04/24 0808  BP: 122/86  Pulse: 84  Resp: 16  SpO2: 97%  Weight: 185 lb 12.8 oz (84.3 kg)  Height: 5' 6 (1.676 m)   Body mass index is 29.99 kg/m. Wt Readings from Last 3 Encounters:  05/04/24 185 lb 12.8 oz (84.3 kg)  04/20/24 180 lb (81.6 kg)  01/03/24 174 lb (78.9 kg)    Physical Exam  Constitutional: No distress.  hemodynamically stable  Neck: No JVD present.  Cardiovascular: Normal rate, regular rhythm, S1 normal and S2 normal. Exam reveals no gallop, no S3 and no S4.  No murmur heard. Pulmonary/Chest: Effort normal and breath sounds normal. No stridor. She has no wheezes. She has no rales.  Musculoskeletal:        General: No edema.     Cervical back: Neck supple.  Skin: Skin is warm.     Impression & Recommendation(s):  Impression:    ICD-10-CM   1. Palpitations  R00.2 EKG 12-Lead    ECHOCARDIOGRAM COMPLETE    2. PVC's (premature ventricular contractions)  I49.3 Exercise Tolerance Test    Cardiac Stress Test: Informed Consent Details: Physician/Practitioner Attestation; Transcribe to consent form and obtain patient signature    ECHOCARDIOGRAM COMPLETE    3. Benign hypertension  I10  Recommendation(s):  Palpitations Chronic. Symptomatic. Cardiac Zio performed recently independently reviewed with the patient at today's visit. Echo will be ordered to evaluate for structural heart disease and left ventricular systolic function. Exercise treadmill stress test to evaluate for exercise-induced arrhythmia. She will have her thyroid  rechecked with PCP.  As of February 2025 within normal limits. Patient is educated on the importance of seeking medical attention if she has a syncopal event.  PVC's (premature ventricular contractions) Symptomatic PVCs noted on recent Zio patch. Discussed pharmacological therapy, patient agreeable. Start Lopressor  12.5 mg p.o. twice daily.  Benign hypertension Blood pressures are well-controlled. Continue amlodipine 5 mg p.o. daily   Orders Placed:  Orders Placed This Encounter  Procedures   Cardiac Stress Test: Informed Consent Details: Physician/Practitioner Attestation; Transcribe to consent form and obtain patient signature    Physician/Practitioner attestation of informed consent for procedure/surgical case:   I, the physician/practitioner, attest that I have discussed with the patient the benefits, risks, side effects, alternatives, likelihood of achieving goals and potential problems during recovery for the procedure that I have provided informed consent.    Procedure:   GXT    Indication/Reason:   PVC   Exercise Tolerance Test    Standing Status:   Future    Expiration Date:   05/04/2025    Where [facility] this [facility] performed[facility]    Stress with  pharmacologic or treadmill ?:   Treadmill w/ exercise   EKG 12-Lead   ECHOCARDIOGRAM COMPLETE    Standing Status:   Future    Expiration Date:   05/04/2025    Where should this test be performed:   [facility]    Does the patient weigh less than or greater than 250 lbs?:   Patient weighs less than 250 lbs    Perflutren DEFINITY (image enhancing agent) should be administered unless hypersensitivity or allergy exist:   Administer Perflutren    Reason for exam-Echo:   Abnormal ECG  R94.31     Final Medication List:    Meds ordered this encounter  Medications   metoprolol  tartrate (LOPRESSOR ) 25 MG tablet    Sig: Take 0.5 tablets (12.5 mg total) by mouth 2 (two) times daily. Refills through PCP.    Dispense:  45 tablet    Refill:  0    Medications Discontinued During This Encounter  Medication Reason   gabapentin  (NEURONTIN ) 300 MG capsule Completed Course   triamcinolone cream (KENALOG) 0.1 % No longer needed (for PRN medications)     Current Outpatient Medications:    amLODipine (NORVASC) 5 MG tablet, Take 1 tablet by mouth daily., Disp: , Rfl:    metoprolol  tartrate (LOPRESSOR ) 25 MG tablet, Take 0.5 tablets (12.5 mg total) by mouth 2 (two) times daily. Refills through PCP., Disp: 45 tablet, Rfl: 0   Multiple Vitamin (MULTIVITAMIN) tablet, Take 1 tablet by mouth daily., Disp: , Rfl:   Consent:   Informed Consent   Shared Decision Making/Informed Consent The risks [chest pain, shortness of breath, cardiac arrhythmias, dizziness, blood pressure fluctuations, myocardial infarction, stroke/transient ischemic attack, and life-threatening complications (estimated to be 1 in 10,000)], benefits (risk stratification, diagnosing coronary artery disease, treatment guidance) and alternatives of an exercise tolerance test were discussed in detail with Ms. Oh and she agrees to proceed.     Disposition:   As needed  Her questions and concerns were addressed to her satisfaction.  She voices understanding of the recommendations provided during this encounter.  Signed, Madonna Michele HAS, [facility]  A Division of [facility] [address], [place],  [address]  05/04/2024 6:50 PM

## 2024-05-06 ENCOUNTER — Telehealth: Payer: Self-pay | Admitting: Plastic Surgery

## 2024-05-06 NOTE — Telephone Encounter (Signed)
 Patient would like to know if she could get an out of pocket quote, or if there is possibly another quote that can be run

## 2024-05-21 ENCOUNTER — Telehealth: Payer: Self-pay | Admitting: Plastic Surgery

## 2024-05-21 ENCOUNTER — Encounter: Payer: Self-pay | Admitting: Plastic Surgery

## 2024-05-21 NOTE — Telephone Encounter (Signed)
 Pt just called and stated she was returning you call, I let her know I would put in a message for you to call her back

## 2024-05-21 NOTE — Telephone Encounter (Signed)
 Patient would like to know if she could get an out of pocket quote, ins denied her

## 2024-05-28 ENCOUNTER — Encounter (HOSPITAL_COMMUNITY): Payer: Self-pay | Admitting: *Deleted

## 2024-06-08 ENCOUNTER — Ambulatory Visit (HOSPITAL_BASED_OUTPATIENT_CLINIC_OR_DEPARTMENT_OTHER)
Admission: RE | Admit: 2024-06-08 | Discharge: 2024-06-08 | Disposition: A | Discharge status: Home / Self Care | Source: Ambulatory Visit | Attending: Cardiology | Admitting: Cardiology

## 2024-06-08 ENCOUNTER — Ambulatory Visit (HOSPITAL_COMMUNITY)
Admission: RE | Admit: 2024-06-08 | Discharge: 2024-06-08 | Disposition: A | Discharge status: Home / Self Care | Source: Ambulatory Visit | Attending: Cardiovascular Disease | Admitting: Cardiovascular Disease

## 2024-06-08 DIAGNOSIS — I493 Ventricular premature depolarization: Secondary | ICD-10-CM | POA: Diagnosis present

## 2024-06-08 DIAGNOSIS — R002 Palpitations: Secondary | ICD-10-CM | POA: Insufficient documentation

## 2024-06-08 LAB — EXERCISE TOLERANCE TEST
Angina Index: 1
Duke Treadmill Score: 3
Estimated workload: 8.5
Exercise duration (min): 7 min
MPHR: 174 {beats}/min
Peak HR: 157 {beats}/min
Percent HR: 90 %
RPE: 18
Rest HR: 77 {beats}/min
ST Depression (mm): 0 mm

## 2024-06-08 LAB — ECHOCARDIOGRAM COMPLETE
AR max vel: 1.71 cm2
AV Area VTI: 1.89 cm2
AV Area mean vel: 1.82 cm2
AV Mean grad: 6 mmHg
AV Peak grad: 12 mmHg
Ao pk vel: 1.73 m/s
Area-P 1/2: 3.94 cm2
S' Lateral: 2.5 cm

## 2024-06-10 ENCOUNTER — Ambulatory Visit: Payer: Self-pay | Admitting: Cardiology

## 2024-06-24 ENCOUNTER — Encounter: Payer: Self-pay | Admitting: Plastic Surgery

## 2024-07-03 ENCOUNTER — Institutional Professional Consult (permissible substitution): Admitting: Plastic Surgery

## 2024-07-07 ENCOUNTER — Institutional Professional Consult (permissible substitution): Admitting: Plastic Surgery

## 2024-07-20 ENCOUNTER — Encounter: Payer: Self-pay | Admitting: Plastic Surgery

## 2024-07-20 ENCOUNTER — Ambulatory Visit (INDEPENDENT_AMBULATORY_CARE_PROVIDER_SITE_OTHER): Admitting: Podiatry

## 2024-07-20 ENCOUNTER — Ambulatory Visit

## 2024-07-20 DIAGNOSIS — M7742 Metatarsalgia, left foot: Secondary | ICD-10-CM | POA: Diagnosis not present

## 2024-07-20 DIAGNOSIS — M21612 Bunion of left foot: Secondary | ICD-10-CM | POA: Diagnosis not present

## 2024-07-20 DIAGNOSIS — M2012 Hallux valgus (acquired), left foot: Secondary | ICD-10-CM

## 2024-07-20 DIAGNOSIS — M65972 Unspecified synovitis and tenosynovitis, left ankle and foot: Secondary | ICD-10-CM

## 2024-07-20 NOTE — Progress Notes (Signed)
  Subjective:  Patient ID: Angela Graham, female    DOB: Nov 14, 1975,  MRN: [id number]  Chief Complaint  Patient presents with   Bunions    6 month follow up left foot pain - pain and stiffnes in the big toe joint and across the top of her foot    DOS: 08/16/2023 Procedure: Left foot Lapidus bunionectomy  48 y.o. female returns for post-op check.  She returns for follow-up today having pain around the second toe and on the top of the arch  Review of Systems: Negative except as noted in the HPI. Denies N/V/F/Ch.   Objective:  There were no vitals filed for this visit. There is no height or weight on file to calculate BMI. Constitutional Well developed. Well nourished.  Vascular Foot warm and well perfused. Capillary refill normal to all digits.  Calf is soft and supple, no posterior calf or knee pain, negative Homans' sign  Neurologic Normal speech. Oriented to person, place, and time. Epicritic sensation to light touch grossly present bilaterally.  Dermatologic Scars are well-healed not hypertrophic  Orthopedic: Good range of motion of the metatarsophalangeal joint.  Most of her discomfort is around the distal second metatarsal and metatarsal phalangeal joint   Multiple view plain film radiographs: Correction maintained, no complication of hardware, no acute fracture or definitive stress fracture some cortical thickening of the medial second metatarsal Assessment:   1. Hallux valgus with bunions, left   2. Metatarsalgia, left foot   3. Synovitis of left foot    Plan:  Patient was evaluated and treated and all questions answered.  S/p foot surgery left - We reviewed her radiographs.  Most of her pain is centered around the second metatarsal phalangeal joint.  Suspect she is developing capsulitis and synovitis of the joint now that this second metatarsal is longer than the first.  We discussed treatment of this including anti-inflammatories corticosteroid injection and custom  molded foot orthoses.  I recommended a custom molded orthosis with a metatarsal pad and unload into the second MTPJ to relieve this for long-term support.  For her acute inflammation I also recommended corticosteroid injection.  We discussed the risks and benefits of the procedure.  Following consent to the second metatarsal phalangeal joint was prepped with Betadine and 10 mg Kenalog and 2.5 mg of Marcaine  was injected directly into the joint.  She tolerated this well.  She was casted for custom molded foot orthoses today and these will be sent off.  Follow-up as needed.  Long-term we discussed that Weil shortening osteotomy may be an option as well if not improving.    No follow-ups on file.

## 2024-08-11 ENCOUNTER — Telehealth: Payer: Self-pay

## 2024-08-11 NOTE — Telephone Encounter (Signed)
 Patient's orthotics are in and being sent to [facility].
